# Patient Record
Sex: Female | Born: 1990 | Race: White | Hispanic: No | Marital: Single | Smoking: Former smoker
Health system: Southern US, Community
[De-identification: ages and names within clinical notes are randomized; demographics above are authoritative.]

## PROBLEM LIST (undated history)

## (undated) ENCOUNTER — Ambulatory Visit: Admission: EM | Payer: Medicaid Other

## (undated) DIAGNOSIS — R112 Nausea with vomiting, unspecified: Secondary | ICD-10-CM

## (undated) DIAGNOSIS — Z9889 Other specified postprocedural states: Secondary | ICD-10-CM

## (undated) DIAGNOSIS — F410 Panic disorder [episodic paroxysmal anxiety] without agoraphobia: Secondary | ICD-10-CM

## (undated) DIAGNOSIS — K219 Gastro-esophageal reflux disease without esophagitis: Secondary | ICD-10-CM

## (undated) DIAGNOSIS — T4145XA Adverse effect of unspecified anesthetic, initial encounter: Secondary | ICD-10-CM

## (undated) DIAGNOSIS — R51 Headache: Secondary | ICD-10-CM

## (undated) DIAGNOSIS — Z87442 Personal history of urinary calculi: Secondary | ICD-10-CM

## (undated) DIAGNOSIS — K649 Unspecified hemorrhoids: Secondary | ICD-10-CM

## (undated) DIAGNOSIS — R001 Bradycardia, unspecified: Secondary | ICD-10-CM

## (undated) DIAGNOSIS — T8859XA Other complications of anesthesia, initial encounter: Secondary | ICD-10-CM

## (undated) DIAGNOSIS — R519 Headache, unspecified: Secondary | ICD-10-CM

## (undated) DIAGNOSIS — J45909 Unspecified asthma, uncomplicated: Secondary | ICD-10-CM

## (undated) HISTORY — PX: TONSILLECTOMY: SUR1361

## (undated) HISTORY — PX: LAPAROSCOPIC GASTRIC RESTRICTIVE DUODENAL PROCEDURE (DUODENAL SWITCH): SHX6667

## (undated) HISTORY — PX: CHOLECYSTECTOMY: SHX55

## (undated) HISTORY — PX: ADENOIDECTOMY: SUR15

---
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
## 2008-12-04 ENCOUNTER — Emergency Department: Payer: Self-pay | Admitting: Emergency Medicine

## 2008-12-18 ENCOUNTER — Emergency Department: Payer: Self-pay | Admitting: Emergency Medicine

## 2009-02-28 ENCOUNTER — Emergency Department: Payer: Self-pay | Admitting: Emergency Medicine

## 2009-06-17 ENCOUNTER — Emergency Department: Payer: Self-pay | Admitting: Emergency Medicine

## 2009-10-14 ENCOUNTER — Emergency Department: Payer: Self-pay | Admitting: Emergency Medicine

## 2009-10-16 ENCOUNTER — Emergency Department: Payer: Self-pay | Admitting: Emergency Medicine

## 2011-03-22 ENCOUNTER — Emergency Department: Payer: Self-pay | Admitting: Emergency Medicine

## 2013-12-27 ENCOUNTER — Ambulatory Visit: Payer: Self-pay | Admitting: Physician Assistant

## 2013-12-29 ENCOUNTER — Ambulatory Visit: Payer: Self-pay | Admitting: Physician Assistant

## 2014-01-01 LAB — RAPID INFLUENZA A&B ANTIGENS

## 2014-12-12 ENCOUNTER — Emergency Department: Payer: Self-pay | Admitting: Emergency Medicine

## 2015-02-14 ENCOUNTER — Emergency Department: Payer: Self-pay | Admitting: Emergency Medicine

## 2015-12-27 ENCOUNTER — Ambulatory Visit
Admission: EM | Admit: 2015-12-27 | Discharge: 2015-12-27 | Disposition: A | Discharge status: Home / Self Care | Payer: Self-pay | Attending: Family Medicine | Admitting: Family Medicine

## 2015-12-27 DIAGNOSIS — J069 Acute upper respiratory infection, unspecified: Secondary | ICD-10-CM

## 2015-12-27 MED ORDER — AZITHROMYCIN 250 MG PO TABS: ORAL_TABLET | ORAL | Status: DC

## 2015-12-27 MED ORDER — AZITHROMYCIN 500 MG PO TABS: ORAL_TABLET | ORAL | Status: DC

## 2015-12-27 MED ORDER — HYDROCOD POLST-CPM POLST ER 10-8 MG/5ML PO SUER: 5.0000 mL | Freq: Two times a day (BID) | ORAL | Status: DC | PRN

## 2015-12-27 MED ORDER — FEXOFENADINE-PSEUDOEPHED ER 180-240 MG PO TB24: 1.0000 | ORAL_TABLET | Freq: Every day | ORAL | Status: DC

## 2015-12-27 MED ORDER — HYDROCOD POLST-CPM POLST ER 10-8 MG/5ML PO SUER: 5.0000 mL | Freq: Two times a day (BID) | ORAL | Status: DC

## 2015-12-27 MED ORDER — FLUTICASONE PROPIONATE 50 MCG/ACT NA SUSP: 2.0000 | Freq: Every day | NASAL | Status: DC

## 2015-12-27 NOTE — Discharge Instructions (Signed)
Cough, Adult A cough helps to clear your throat and lungs. A cough may last only 2-3 weeks (acute), or it may last longer than 8 weeks (chronic). Many different things can cause a cough. A cough may be a sign of an illness or another medical condition. HOME CARE  Pay attention to any changes in your cough.  Take medicines only as told by your doctor.  If you were prescribed an antibiotic medicine, take it as told by your doctor. Do not stop taking it even if you start to feel better.  Talk with your doctor before you try using a cough medicine.  Drink enough fluid to keep your pee (urine) clear or pale yellow.  If the air is dry, use a cold steam vaporizer or humidifier in your home.  Stay away from things that make you cough at work or at home.  If your cough is worse at night, try using extra pillows to raise your head up higher while you sleep.  Do not smoke, and try not to be around smoke. If you need help quitting, ask your doctor.  Do not have caffeine.  Do not drink alcohol.  Rest as needed. GET HELP IF:  You have new problems (symptoms).  You cough up yellow fluid (pus).  Your cough does not get better after 2-3 weeks, or your cough gets worse.  Medicine does not help your cough and you are not sleeping well.  You have pain that gets worse or pain that is not helped with medicine.  You have a fever.  You are losing weight and you do not know why.  You have night sweats. GET HELP RIGHT AWAY IF:  You cough up blood.  You have trouble breathing.  Your heartbeat is very fast.   This information is not intended to replace advice given to you by your health care provider. Make sure you discuss any questions you have with your health care provider.   Document Released: 08/21/2011 Document Revised: 08/29/2015 Document Reviewed: 02/14/2015 Elsevier Interactive Patient Education 2016 [employer].  [employer] Vaporizers Vaporizers may help relieve the  symptoms of a cough and cold. They add moisture to the air, which helps mucus to become thinner and less sticky. This makes it easier to breathe and cough up secretions. Cool mist vaporizers do not cause serious burns like hot mist vaporizers, which may also be called steamers or humidifiers. Vaporizers have not been proven to help with colds. You should not use a vaporizer if you are allergic to mold. HOME CARE INSTRUCTIONS  Follow the package instructions for the vaporizer.  Do not use anything other than distilled water in the vaporizer.  Do not run the vaporizer all of the time. This can cause mold or bacteria to grow in the vaporizer.  Clean the vaporizer after each time it is used.  Clean and dry the vaporizer well before storing it.  Stop using the vaporizer if worsening respiratory symptoms develop.   This information is not intended to replace advice given to you by your health care provider. Make sure you discuss any questions you have with your health care provider.   Document Released: 09/04/2004 Document Revised: 12/13/2013 Document Reviewed: 04/27/2013 Elsevier Interactive Patient Education 2016 Elsevier Inc.  Upper Respiratory Infection, Adult Most upper respiratory infections (URIs) are a viral infection of the air passages leading to the lungs. A URI affects the nose, throat, and upper air passages. The most common type of URI is nasopharyngitis  and is typically referred to as "the common cold." URIs run their course and usually go away on their own. Most of the time, a URI does not require medical attention, but sometimes a bacterial infection in the upper airways can follow a viral infection. This is called a secondary infection. Sinus and middle ear infections are common types of secondary upper respiratory infections. Bacterial pneumonia can also complicate a URI. A URI can worsen asthma and chronic obstructive pulmonary disease (COPD). Sometimes, these complications can  require emergency medical care and may be life threatening.  CAUSES Almost all URIs are caused by viruses. A virus is a type of germ and can spread from one person to another.  RISKS FACTORS You may be at risk for a URI if:   You smoke.   You have chronic heart or lung disease.  You have a weakened defense (immune) system.   You are very young or very old.   You have nasal allergies or asthma.  You work in crowded or poorly ventilated areas.  You work in health care facilities or schools. SIGNS AND SYMPTOMS  Symptoms typically develop 2-3 days after you come in contact with a cold virus. Most viral URIs last 7-10 days. However, viral URIs from the influenza virus (flu virus) can last 14-18 days and are typically more severe. Symptoms may include:   Runny or stuffy (congested) nose.   Sneezing.   Cough.   Sore throat.   Headache.   Fatigue.   Fever.   Loss of appetite.   Pain in your forehead, behind your eyes, and over your cheekbones (sinus pain).  Muscle aches.  DIAGNOSIS  Your health care provider may diagnose a URI by:  Physical exam.  Tests to check that your symptoms are not due to another condition such as:  Strep throat.  Sinusitis.  Pneumonia.  Asthma. TREATMENT  A URI goes away on its own with time. It cannot be cured with medicines, but medicines may be prescribed or recommended to relieve symptoms. Medicines may help:  Reduce your fever.  Reduce your cough.  Relieve nasal congestion. HOME CARE INSTRUCTIONS   Take medicines only as directed by your health care provider.   Gargle warm saltwater or take cough drops to comfort your throat as directed by your health care provider.  Use a warm mist humidifier or inhale steam from a shower to increase air moisture. This may make it easier to breathe.  Drink enough fluid to keep your urine clear or pale yellow.   Eat soups and other clear broths and maintain good nutrition.    Rest as needed.   Return to work when your temperature has returned to normal or as your health care provider advises. You may need to stay home longer to avoid infecting others. You can also use a face mask and careful hand washing to prevent spread of the virus.  Increase the usage of your inhaler if you have asthma.   Do not use any tobacco products, including cigarettes, chewing tobacco, or electronic cigarettes. If you need help quitting, ask your health care provider. PREVENTION  The best way to protect yourself from getting a cold is to practice good hygiene.   Avoid oral or hand contact with people with cold symptoms.   Wash your hands often if contact occurs.  There is no clear evidence that vitamin C, vitamin E, echinacea, or exercise reduces the chance of developing a cold. However, it is always recommended to get  plenty of rest, exercise, and practice good nutrition.  SEEK MEDICAL CARE IF:   You are getting worse rather than better.   Your symptoms are not controlled by medicine.   You have chills.  You have worsening shortness of breath.  You have brown or red mucus.  You have yellow or brown nasal discharge.  You have pain in your face, especially when you bend forward.  You have a fever.  You have swollen neck glands.  You have pain while swallowing.  You have white areas in the back of your throat. SEEK IMMEDIATE MEDICAL CARE IF:   You have severe or persistent:  Headache.  Ear pain.  Sinus pain.  Chest pain.  You have chronic lung disease and any of the following:  Wheezing.  Prolonged cough.  Coughing up blood.  A change in your usual mucus.  You have a stiff neck.  You have changes in your:  Vision.  Hearing.  Thinking.  Mood. MAKE SURE YOU:   Understand these instructions.  Will watch your condition.  Will get help right away if you are not doing well or get worse.   This information is not intended to  replace advice given to you by your health care provider. Make sure you discuss any questions you have with your health care provider.   Document Released: 06/03/2001 Document Revised: 04/24/2015 Document Reviewed: 03/15/2014 Elsevier Interactive Patient Education [employer]

## 2015-12-27 NOTE — ED Notes (Signed)
Past week with head congestion.  OTCs not helping.  Ears hurt to palpate.  Pressure is all in her head.

## 2015-12-27 NOTE — ED Notes (Signed)
Non-toxic appearing pt.  Reporting cough with lots of phlegm production.  In NAD.

## 2015-12-27 NOTE — ED Provider Notes (Signed)
CSN: [id number]     Arrival date & time 12/27/15  1908 History   First MD Initiated Contact with Patient 12/27/15 1956     Chief Complaint  Patient presents with  . Sinusitis  . Facial Pain  . Otalgia   (Consider location/radiation/quality/duration/timing/severity/associated sxs/prior Treatment) HPI   A 25 year old female who presents with a one-week history of head congestion and cough mostly at nighttime. He said the cough is productive of yellow sputum in the morning. Her nose is running all day long at times it feels like it is clogged. Tried numerous over-the-counter remedies which have not been effective. Running a very low-grade temperature and she states 998 at work today. As you Sudafed NyQuil and DayQuil. She is afebrile today at 97.8 pulse 80 respirations 16 blood pressure 144/78 and PO2 of 98% on room air.  History reviewed. No pertinent past medical history. Past Surgical History  Procedure Laterality Date  . Cesarean section    . Tonsillectomy    . Adenoidectomy     History reviewed. No pertinent family history. Social History  Substance Use Topics  . Smoking status: Current Every Day Smoker -- 0.25 packs/day    Types: Cigarettes  . Smokeless tobacco: None  . Alcohol Use: No   OB History    No data available     Review of Systems  Constitutional: Positive for fever and fatigue. Negative for chills, diaphoresis and activity change.  HENT: Positive for congestion, postnasal drip, rhinorrhea, sinus pressure and sneezing.   Respiratory: Positive for cough. Negative for shortness of breath, wheezing and stridor.   All other systems reviewed and are negative.   Allergies  Morphine and related  Home Medications   Prior to Admission medications   Medication Sig Start Date End Date Taking? Authorizing Provider  azithromycin (ZITHROMAX Z-PAK) 250 MG tablet Use as per package instructions 12/27/15   Lutricia Feil, PA-C  chlorpheniramine-HYDROcodone [facility]) 10-8 MG/5ML SUER Take 5 mLs by mouth every 12 (twelve) hours as needed for cough. 12/27/15   Hassan Rowan, MD  chlorpheniramine-HYDROcodone [facility] PENNKINETIC ER) 10-8 MG/5ML SUER Take 5 mLs by mouth 2 (two) times daily. 12/27/15   Lutricia Feil, PA-C  fexofenadine-pseudoephedrine (ALLEGRA-D ALLERGY & CONGESTION) 180-240 MG 24 hr tablet Take 1 tablet by mouth daily. 12/27/15   Hassan Rowan, MD  fluticasone (FLONASE) 50 MCG/ACT nasal spray Place 2 sprays into both nostrils daily. 12/27/15   Lutricia Feil, PA-C   Meds Ordered and Administered this Visit  Medications - No data to display  BP 144/78 mmHg  Pulse 84  Temp(Src) 97.8 F (36.6 C) (Oral)  Resp 16  SpO2 98% No data found.   Physical Exam  ED Course  Procedures (including critical care time)  Labs Review Labs Reviewed - No data to display  Imaging Review No results found.   Visual Acuity Review  Right Eye Distance:   Left Eye Distance:   Bilateral Distance:    Right Eye Near:   Left Eye Near:    Bilateral Near:         MDM   1. Acute URI    Discharge Medication List as of 12/27/2015  8:14 PM    START taking these medications   Details  azithromycin (ZITHROMAX Z-PAK) 250 MG tablet Use as per package instructions, Print    !! chlorpheniramine-HYDROcodone (TUSSIONEX PENNKINETIC ER) 10-8 MG/5ML SUER Take 5 mLs by mouth every 12 (twelve) hours as needed for cough., Starting 12/27/2015, Until  Discontinued, Normal    !! chlorpheniramine-HYDROcodone (TUSSIONEX PENNKINETIC ER) 10-8 MG/5ML SUER Take 5 mLs by mouth 2 (two) times daily., Starting 12/27/2015, Until Discontinued, Print    fexofenadine-pseudoephedrine (ALLEGRA-D ALLERGY & CONGESTION) 180-240 MG 24 hr tablet Take 1 tablet by mouth daily., Starting 12/27/2015, Until Discontinued, Print    fluticasone (FLONASE) 50 MCG/ACT nasal spray Place 2 sprays into both nostrils daily., Starting 12/27/2015, Until Discontinued, Print     !! - Potential  duplicate medications found. Please discuss with provider.    Plan: 1. Diagnosis reviewed with patient 2. rx as per orders; risks, benefits, potential side effects reviewed with patient 3. Recommend supportive treatment with rest as much as possible and increase fluids. Been using her son's Flonase and I have told her that she needs to purchase her own passing back and forth. She is a Production designer, theatre/television/filmmanager at [employer] and is unable to take any time off since she is just returning from her 4 day break. Have given her prescription discount card to assist her and she is uninsured. She continued with the over-the-counter medications 4. F/u prn if symptoms worsen or don't improve     Lutricia FeilWilliam P Eva Griffo, PA-C 12/27/15 2024

## 2016-07-01 ENCOUNTER — Ambulatory Visit
Admission: EM | Admit: 2016-07-01 | Discharge: 2016-07-01 | Disposition: A | Discharge status: Home / Self Care | Payer: Self-pay | Attending: Internal Medicine | Admitting: Internal Medicine

## 2016-07-01 DIAGNOSIS — L739 Follicular disorder, unspecified: Secondary | ICD-10-CM

## 2016-07-01 MED ORDER — PREDNISONE 50 MG PO TABS: 50.0000 mg | ORAL_TABLET | Freq: Every day | ORAL | Status: DC

## 2016-07-01 MED ORDER — SULFAMETHOXAZOLE-TRIMETHOPRIM 800-160 MG PO TABS: 1.0000 | ORAL_TABLET | Freq: Two times a day (BID) | ORAL | Status: AC

## 2016-07-01 NOTE — Discharge Instructions (Signed)
Anticipate gradual improvement over the next several days.  Prescriptions for trimethoprim/sulfamethoxazole and prednisone [facility].   Recheck for fever >100.5; bumps or sores around mouth/eyes/genitals; or if not starting to improve in 3-5 days.

## 2016-07-01 NOTE — ED Provider Notes (Signed)
CSN: [id number]     Arrival date & time 07/01/16  1230 History   First MD Initiated Contact with Patient 07/01/16 1419     Chief Complaint  Patient presents with  . Rash   HPI  24yo lady presents today with 2d hx extremely itchy red pimply bumps R posterior shoulder, B arms, one of her upper/posterior legs. No new achiness, no fever.  1 episode diarrhea after eating yesterday.  No N/V, no cough.  Itching comes and goes.   Toddler son sleeps with her and does not have it. Hx eczema; seeing dermatologist for this in a few days.   Past medical history:  eczema Past Surgical History  Procedure Laterality Date  . Cesarean section    . Tonsillectomy    . Adenoidectomy     History reviewed. No pertinent family history. Social History  Substance Use Topics  . Smoking status: Current Every Day Smoker -- 0.25 packs/day    Types: Cigarettes  . Smokeless tobacco: None  . Alcohol Use: No    Review of Systems  All other systems reviewed and are negative.   Allergies  Morphine and related  Home Medications   Topicals for eczema    BP 127/66 mmHg  Pulse 73  Temp(Src) 98.8 F (37.1 C) (Oral)  Resp 18  Ht 5\' 3"  (1.6 m)  Wt 265 lb (120.203 kg)  BMI 46.95 kg/m2  SpO2 97% Physical Exam  Constitutional: She is oriented to person, place, and time. No distress.  Alert, nicely groomed  HENT:  Head: Atraumatic.  Eyes:  Conjugate gaze, no eye redness/drainage  Neck: Neck supple.  Cardiovascular: Normal rate.   Pulmonary/Chest: No respiratory distress.  Abdominal: She exhibits no distension.  Musculoskeletal: Normal range of motion.  No leg swelling  Neurological: She is alert and oriented to person, place, and time.  Skin: Skin is warm and dry.     No cyanosis Deep pustules, firm, on red base in clusters over R posterior shoulder, both arms, upper/posterior thigh.  No ulceration/seeping/drainage.  Not tender.  Nursing note and vitals reviewed.   ED Course  Procedures  (including critical care time)  none  MDM   1. Follicular impetigo    Meds ordered this encounter  Medications  . sulfamethoxazole-trimethoprim (BACTRIM DS,SEPTRA DS) 800-160 MG tablet    Sig: Take 1 tablet by mouth 2 (two) times daily.    Dispense:  14 tablet    Refill:  0  . predniSONE (DELTASONE) 50 MG tablet    Sig: Take 1 tablet (50 mg total) by mouth daily.    Dispense:  3 tablet    Refill:  0   Recheck or [facility] if not starting to improve in a few days, or for fever >100.5/rash involving mouth/eyes/genitals.    Eustace MooreLaura W Devlin Brink, MD 07/02/16 1410

## 2016-07-01 NOTE — ED Notes (Addendum)
Patient noticed on Sunday that she had some bumps on her right shoulder that were very itchy. They seem to be getting worse and she had tired benadryl and hydrocortisone cream OTC with no relief. Bumps are located on right shoulder, bilateral arms and a few on her legs. Severe itching.

## 2016-12-22 DIAGNOSIS — I82409 Acute embolism and thrombosis of unspecified deep veins of unspecified lower extremity: Secondary | ICD-10-CM

## 2016-12-22 HISTORY — DX: Acute embolism and thrombosis of unspecified deep veins of unspecified lower extremity: I82.409

## 2018-01-04 ENCOUNTER — Inpatient Hospital Stay
Admission: RE | Admit: 2018-01-04 | Discharge: 2018-01-04 | Disposition: A | Discharge status: Home / Self Care | Payer: Self-pay | Source: Ambulatory Visit

## 2018-01-05 ENCOUNTER — Encounter
Admission: RE | Admit: 2018-01-05 | Discharge: 2018-01-05 | Disposition: A | Discharge status: Home / Self Care | Payer: BC Managed Care – PPO | Source: Ambulatory Visit | Attending: Obstetrics & Gynecology | Admitting: Obstetrics & Gynecology

## 2018-01-05 ENCOUNTER — Other Ambulatory Visit: Payer: Self-pay

## 2018-01-05 HISTORY — DX: Headache: R51

## 2018-01-05 HISTORY — DX: Other complications of anesthesia, initial encounter: T88.59XA

## 2018-01-05 HISTORY — DX: Unspecified hemorrhoids: K64.9

## 2018-01-05 HISTORY — DX: Personal history of urinary calculi: Z87.442

## 2018-01-05 HISTORY — DX: Gastro-esophageal reflux disease without esophagitis: K21.9

## 2018-01-05 HISTORY — DX: Headache, unspecified: R51.9

## 2018-01-05 HISTORY — DX: Nausea with vomiting, unspecified: R11.2

## 2018-01-05 HISTORY — DX: Adverse effect of unspecified anesthetic, initial encounter: T41.45XA

## 2018-01-05 HISTORY — DX: Panic disorder (episodic paroxysmal anxiety): F41.0

## 2018-01-05 HISTORY — DX: Other specified postprocedural states: Z98.890

## 2018-01-05 HISTORY — DX: Bradycardia, unspecified: R00.1

## 2018-01-05 NOTE — Patient Instructions (Signed)
  Your procedure is scheduled on: 01-11-18 MONDAY Report to Same Day Surgery 2nd floor medical mall [facility](Medical Mall Entrance-take elevator on left to 2nd floor.  Check in with surgery information desk.) To find out your arrival time please call [phone] between 1PM - 3PM on 01-08-18 FRIDAY  Remember: Instructions that are not followed completely may result in serious medical risk, up to and including death, or upon the discretion of your surgeon and anesthesiologist your surgery may need to be rescheduled.    _x___ 1. Do not eat food after midnight the night before your procedure. NO GUM OR CANDY AFTER MIDNIGHT.  You may drink clear liquids up to 2 hours before you are scheduled to arrive at the hospital for your procedure.  Do not drink clear liquids within 2 hours of your scheduled arrival to the hospital.  Clear liquids include  --Water or Apple juice without pulp  --Clear carbohydrate beverage such as ClearFast or Gatorade  --Black Coffee or Clear Tea (No milk, no creamers, do not add anything to the coffee or Tea     __x__ 2. No Alcohol for 24 hours before or after surgery.   __x__3. No Smoking for 24 prior to surgery.   ____  4. Bring all medications with you on the day of surgery if instructed.    __x__ 5. Notify your doctor if there is any change in your medical condition     (cold, fever, infections).     Do not wear jewelry, make-up, hairpins, clips or nail polish.  Do not wear lotions, powders, or perfumes. You may wear deodorant.  Do not shave 48 hours prior to surgery. Men may shave face and neck.  Do not bring valuables to the hospital.    [facility] is not responsible for any belongings or valuables.               Contacts, dentures or bridgework may not be worn into surgery.  Leave your suitcase in the car. After surgery it may be brought to your room.  For patients admitted to the hospital, discharge time is determined by your treatment team.   Patients discharged  the day of surgery will not be allowed to drive home.  You will need someone to drive you home and stay with you the night of your procedure.    Please read over the following fact sheets that you were given:   [facility] Preparing for Surgery and or MRSA Information   ____ Take anti-hypertensive listed below, cardiac, seizure, asthma, anti-reflux and psychiatric medicines. These include:  1. NONE  2.  3.  4.  5.  6.  ____Fleets enema or Magnesium Citrate as directed.   ____ Use CHG Soap or sage wipes as directed on instruction sheet   ____ Use inhalers on the day of surgery and bring to hospital day of surgery  ____ Stop Metformin and Janumet 2 days prior to surgery.    ____ Take 1/2 of usual insulin dose the night before surgery and none on the morning surgery.   ____ Follow recommendations from Cardiologist, Pulmonologist or PCP regarding stopping Aspirin, Coumadin, Plavix ,Eliquis, Effient, or Pradaxa, and Pletal.  X____Stop Anti-inflammatories such as Advil, Aleve, Ibuprofen, Motrin, Naproxen, Naprosyn, Goodies powders or aspirin products NOW-OK to take Tylenol   ____ Stop supplements until after surgery.     ____ Bring C-Pap to the hospital.

## 2018-01-07 ENCOUNTER — Encounter
Admission: RE | Admit: 2018-01-07 | Discharge: 2018-01-07 | Disposition: A | Discharge status: Home / Self Care | Payer: BC Managed Care – PPO | Source: Ambulatory Visit | Attending: Obstetrics & Gynecology | Admitting: Obstetrics & Gynecology

## 2018-01-07 DIAGNOSIS — Z01812 Encounter for preprocedural laboratory examination: Secondary | ICD-10-CM | POA: Diagnosis not present

## 2018-01-07 LAB — CBC
HCT: 43.2 % (ref 35.0–47.0)
Hemoglobin: 14.4 g/dL (ref 12.0–16.0)
MCH: 29.7 pg (ref 26.0–34.0)
MCHC: 33.4 g/dL (ref 32.0–36.0)
MCV: 89 fL (ref 80.0–100.0)
PLATELETS: 189 10*3/uL (ref 150–440)
RBC: 4.86 MIL/uL (ref 3.80–5.20)
RDW: 13 % (ref 11.5–14.5)
WBC: 8.2 10*3/uL (ref 3.6–11.0)

## 2018-01-07 LAB — BASIC METABOLIC PANEL
Anion gap: 11 (ref 5–15)
BUN: 10 mg/dL (ref 6–20)
CALCIUM: 9.4 mg/dL (ref 8.9–10.3)
CO2: 25 mmol/L (ref 22–32)
CREATININE: 0.53 mg/dL (ref 0.44–1.00)
Chloride: 105 mmol/L (ref 101–111)
GFR calc non Af Amer: >60 mL/min (ref >60)
GLUCOSE: 84 mg/dL (ref 65–99)
Potassium: 3.2 mmol/L — ABNORMAL LOW (ref 3.5–5.1)
Sodium: 141 mmol/L (ref 135–145)

## 2018-01-07 LAB — TYPE AND SCREEN
ABO/RH(D): AB POS
Antibody Screen: NEGATIVE

## 2018-01-11 ENCOUNTER — Ambulatory Visit
Admission: RE | Admit: 2018-01-11 | Discharge: 2018-01-11 | Disposition: A | Discharge status: Home / Self Care | Payer: BC Managed Care – PPO | Source: Ambulatory Visit | Attending: Surgery | Admitting: Surgery

## 2018-01-11 ENCOUNTER — Encounter: Payer: Self-pay | Admitting: Surgery

## 2018-01-11 ENCOUNTER — Ambulatory Visit: Payer: BC Managed Care – PPO | Admitting: Anesthesiology

## 2018-01-11 ENCOUNTER — Encounter
Admission: RE | Disposition: A | Discharge status: Home / Self Care | Payer: Self-pay | Source: Ambulatory Visit | Attending: Obstetrics & Gynecology

## 2018-01-11 ENCOUNTER — Encounter: Payer: Self-pay | Admitting: Anesthesiology

## 2018-01-11 DIAGNOSIS — N938 Other specified abnormal uterine and vaginal bleeding: Secondary | ICD-10-CM | POA: Insufficient documentation

## 2018-01-11 DIAGNOSIS — K219 Gastro-esophageal reflux disease without esophagitis: Secondary | ICD-10-CM | POA: Diagnosis not present

## 2018-01-11 DIAGNOSIS — Z9049 Acquired absence of other specified parts of digestive tract: Secondary | ICD-10-CM | POA: Insufficient documentation

## 2018-01-11 DIAGNOSIS — Z825 Family history of asthma and other chronic lower respiratory diseases: Secondary | ICD-10-CM | POA: Insufficient documentation

## 2018-01-11 DIAGNOSIS — R9389 Abnormal findings on diagnostic imaging of other specified body structures: Secondary | ICD-10-CM | POA: Diagnosis not present

## 2018-01-11 DIAGNOSIS — K648 Other hemorrhoids: Secondary | ICD-10-CM | POA: Diagnosis not present

## 2018-01-11 DIAGNOSIS — Z808 Family history of malignant neoplasm of other organs or systems: Secondary | ICD-10-CM | POA: Insufficient documentation

## 2018-01-11 DIAGNOSIS — Z87891 Personal history of nicotine dependence: Secondary | ICD-10-CM | POA: Insufficient documentation

## 2018-01-11 DIAGNOSIS — K602 Anal fissure, unspecified: Secondary | ICD-10-CM | POA: Diagnosis not present

## 2018-01-11 DIAGNOSIS — G43909 Migraine, unspecified, not intractable, without status migrainosus: Secondary | ICD-10-CM | POA: Insufficient documentation

## 2018-01-11 DIAGNOSIS — Z79899 Other long term (current) drug therapy: Secondary | ICD-10-CM | POA: Insufficient documentation

## 2018-01-11 DIAGNOSIS — Z818 Family history of other mental and behavioral disorders: Secondary | ICD-10-CM | POA: Insufficient documentation

## 2018-01-11 DIAGNOSIS — Z87442 Personal history of urinary calculi: Secondary | ICD-10-CM | POA: Insufficient documentation

## 2018-01-11 DIAGNOSIS — Z820 Family history of epilepsy and other diseases of the nervous system: Secondary | ICD-10-CM | POA: Diagnosis not present

## 2018-01-11 DIAGNOSIS — F410 Panic disorder [episodic paroxysmal anxiety] without agoraphobia: Secondary | ICD-10-CM | POA: Diagnosis not present

## 2018-01-11 DIAGNOSIS — Z803 Family history of malignant neoplasm of breast: Secondary | ICD-10-CM | POA: Insufficient documentation

## 2018-01-11 HISTORY — PX: HYSTEROSCOPY WITH D & C: SHX1775

## 2018-01-11 HISTORY — PX: RECTAL EXAM UNDER ANESTHESIA: SHX6399

## 2018-01-11 HISTORY — PX: SPHINCTEROTOMY: SHX5279

## 2018-01-11 LAB — ABO/RH: ABO/RH(D): AB POS

## 2018-01-11 LAB — POCT PREGNANCY, URINE: Preg Test, Ur: NEGATIVE

## 2018-01-11 SURGERY — DILATATION AND CURETTAGE /HYSTEROSCOPY
Anesthesia: General | Wound class: Clean Contaminated

## 2018-01-11 MED ORDER — HYDROCODONE-ACETAMINOPHEN 5-325 MG PO TABS: 1.0000 | ORAL_TABLET | Freq: Four times a day (QID) | ORAL | 0 refills | Status: DC | PRN

## 2018-01-11 MED ORDER — PROPOFOL 10 MG/ML IV BOLUS
INTRAVENOUS | Status: DC | PRN
  Administered 2018-01-11: 200 mg via INTRAVENOUS

## 2018-01-11 MED ORDER — FENTANYL CITRATE (PF) 100 MCG/2ML IJ SOLN
25.0000 ug | INTRAMUSCULAR | Status: AC | PRN
  Administered 2018-01-11 (×6): 25 ug via INTRAVENOUS

## 2018-01-11 MED ORDER — BUPIVACAINE-EPINEPHRINE (PF) 0.5% -1:200000 IJ SOLN
INTRAMUSCULAR | Status: AC
  Filled 2018-01-11: qty 30

## 2018-01-11 MED ORDER — FENTANYL CITRATE (PF) 100 MCG/2ML IJ SOLN
INTRAMUSCULAR | Status: AC
  Administered 2018-01-11: 25 ug via INTRAVENOUS
  Filled 2018-01-11: qty 2

## 2018-01-11 MED ORDER — LIDOCAINE HCL (CARDIAC) 20 MG/ML IV SOLN
INTRAVENOUS | Status: DC | PRN
  Administered 2018-01-11: 50 mg via INTRAVENOUS

## 2018-01-11 MED ORDER — HYDROMORPHONE HCL 1 MG/ML IJ SOLN
INTRAMUSCULAR | Status: AC
  Filled 2018-01-11: qty 1

## 2018-01-11 MED ORDER — EPHEDRINE SULFATE 50 MG/ML IJ SOLN
INTRAMUSCULAR | Status: DC | PRN
  Administered 2018-01-11 (×2): 10 mg via INTRAVENOUS

## 2018-01-11 MED ORDER — KETOROLAC TROMETHAMINE 30 MG/ML IJ SOLN
INTRAMUSCULAR | Status: AC
  Filled 2018-01-11: qty 1

## 2018-01-11 MED ORDER — BUPIVACAINE-EPINEPHRINE (PF) 0.5% -1:200000 IJ SOLN
INTRAMUSCULAR | Status: DC | PRN
  Administered 2018-01-11: 3 mL via PERINEURAL

## 2018-01-11 MED ORDER — ACETAMINOPHEN 650 MG RE SUPP
650.0000 mg | RECTAL | Status: DC | PRN
Start: 2018-01-11 — End: 2018-01-11

## 2018-01-11 MED ORDER — SILVER NITRATE-POT NITRATE 75-25 % EX MISC
CUTANEOUS | Status: AC
  Filled 2018-01-11: qty 1

## 2018-01-11 MED ORDER — KETOROLAC TROMETHAMINE 30 MG/ML IJ SOLN
30.0000 mg | Freq: Four times a day (QID) | INTRAMUSCULAR | Status: DC
  Administered 2018-01-11: 30 mg via INTRAVENOUS

## 2018-01-11 MED ORDER — EPHEDRINE SULFATE 50 MG/ML IJ SOLN
INTRAMUSCULAR | Status: AC
  Filled 2018-01-11: qty 1

## 2018-01-11 MED ORDER — MIDAZOLAM HCL 2 MG/2ML IJ SOLN
INTRAMUSCULAR | Status: DC | PRN
  Administered 2018-01-11: 2 mg via INTRAVENOUS

## 2018-01-11 MED ORDER — HYDROMORPHONE HCL 1 MG/ML IJ SOLN
0.5000 mg | INTRAMUSCULAR | Status: DC | PRN
  Administered 2018-01-11: 0.5 mg via INTRAVENOUS

## 2018-01-11 MED ORDER — ACETAMINOPHEN 325 MG PO TABS: 650.0000 mg | ORAL_TABLET | ORAL | Status: DC | PRN

## 2018-01-11 MED ORDER — ACETAMINOPHEN NICU IV SYRINGE 10 MG/ML
INTRAVENOUS | Status: AC
  Filled 2018-01-11: qty 1

## 2018-01-11 MED ORDER — LIDOCAINE HCL (PF) 2 % IJ SOLN
INTRAMUSCULAR | Status: AC
  Filled 2018-01-11: qty 10

## 2018-01-11 MED ORDER — LACTATED RINGERS IV SOLN: INTRAVENOUS | Status: DC

## 2018-01-11 MED ORDER — MIDAZOLAM HCL 2 MG/2ML IJ SOLN
INTRAMUSCULAR | Status: AC
  Filled 2018-01-11: qty 2

## 2018-01-11 MED ORDER — DEXAMETHASONE SODIUM PHOSPHATE 10 MG/ML IJ SOLN
INTRAMUSCULAR | Status: DC | PRN
  Administered 2018-01-11: 10 mg via INTRAVENOUS

## 2018-01-11 MED ORDER — ONDANSETRON HCL 4 MG/2ML IJ SOLN
INTRAMUSCULAR | Status: DC | PRN
  Administered 2018-01-11: 4 mg via INTRAVENOUS

## 2018-01-11 MED ORDER — ACETAMINOPHEN 10 MG/ML IV SOLN
INTRAVENOUS | Status: DC | PRN
  Administered 2018-01-11: 1000 mg via INTRAVENOUS

## 2018-01-11 MED ORDER — FENTANYL CITRATE (PF) 100 MCG/2ML IJ SOLN
INTRAMUSCULAR | Status: DC | PRN
  Administered 2018-01-11 (×4): 50 ug via INTRAVENOUS

## 2018-01-11 MED ORDER — LACTATED RINGERS IV SOLN
INTRAVENOUS | Status: DC
  Administered 2018-01-11: 11:00:00 via INTRAVENOUS

## 2018-01-11 MED ORDER — PROPOFOL 10 MG/ML IV BOLUS
INTRAVENOUS | Status: AC
  Filled 2018-01-11: qty 20

## 2018-01-11 MED ORDER — FENTANYL CITRATE (PF) 100 MCG/2ML IJ SOLN
INTRAMUSCULAR | Status: AC
  Filled 2018-01-11: qty 2

## 2018-01-11 MED ORDER — ONDANSETRON HCL 4 MG/2ML IJ SOLN: 4.0000 mg | Freq: Once | INTRAMUSCULAR | Status: DC | PRN

## 2018-01-11 SURGICAL SUPPLY — 26 items
BAG INFUSER PRESSURE 100CC (MISCELLANEOUS) ×4 IMPLANT
BLADE SURG 15 STRL LF DISP TIS (BLADE) ×2 IMPLANT
BLADE SURG 15 STRL SS (BLADE) ×2
COUNTER NEEDLE 20/40 LG (NEEDLE) ×4 IMPLANT
CUP MEDICINE 2OZ PLAST GRAD ST (MISCELLANEOUS) ×4 IMPLANT
ELECT REM PT RETURN 9FT ADLT (ELECTROSURGICAL) ×4
ELECTRODE REM PT RTRN 9FT ADLT (ELECTROSURGICAL) ×2 IMPLANT
GLOVE PI ORTHOPRO 6.5 (GLOVE) ×2
GLOVE PI ORTHOPRO STRL 6.5 (GLOVE) ×2 IMPLANT
GLOVE SURG SYN 6.5 ES PF (GLOVE) ×4 IMPLANT
GOWN STRL REUS W/ TWL LRG LVL3 (GOWN DISPOSABLE) ×4 IMPLANT
GOWN STRL REUS W/TWL LRG LVL3 (GOWN DISPOSABLE) ×4
HANDLE YANKAUER SUCT BULB TIP (MISCELLANEOUS) ×4 IMPLANT
IV LACTATED RINGERS 1000ML (IV SOLUTION) ×4 IMPLANT
KIT RM TURNOVER CYSTO AR (KITS) ×4 IMPLANT
NEEDLE HYPO 25X1 1.5 SAFETY (NEEDLE) ×4 IMPLANT
NEEDLE SPNL 22GX3.5 QUINCKE BK (NEEDLE) ×4 IMPLANT
PACK DNC HYST (MISCELLANEOUS) ×4 IMPLANT
PAD OB MATERNITY 4.3X12.25 (PERSONAL CARE ITEMS) ×4 IMPLANT
PAD PREP 24X41 OB/GYN DISP (PERSONAL CARE ITEMS) ×4 IMPLANT
PENCIL ELECTRO HAND CTR (MISCELLANEOUS) ×4 IMPLANT
SPONGE XRAY 4X4 16PLY STRL (MISCELLANEOUS) ×4 IMPLANT
SUT CHROMIC 4 0 SH 27 (SUTURE) ×4 IMPLANT
SYR 10ML LL (SYRINGE) ×8 IMPLANT
TUBING CONNECTING 10 (TUBING) ×6 IMPLANT
TUBING CONNECTING 10' (TUBING) ×2

## 2018-01-11 NOTE — Op Note (Signed)
OPERATIVE REPORT  PREOPERATIVE  DIAGNOSIS: .  Anal pain, internal hemorrhoids  POSTOPERATIVE DIAGNOSIS: .  Posterior anal fissure  PROCEDURE: .  Rectal exam under anesthesia and lateral internal anal sphincterotomy  ANESTHESIA:  General  SURGEON: Renda RollsWilton Smith  MD   INDICATIONS: .  She has history of anal pain and recent findings of internal hemorrhoids.  She had recent internal hemorrhoid rubber band.  She reports persistent anal pain.  Rectal exam under anesthesia was recommended for further evaluation and discussed potential need for further surgery.  With the patient on the operating table under general anesthesia in the lithotomy position after completing D&C the rectum was examined.  External anal appearance found small [facility] which was removed with a moist sponge.  Traction of the anoderm did demonstrate a posterior anal fissure which was small in size.  The anal canal was dilated large enough to admit 3 fingers.  There was no palpable mass although could detect some mild degree of local firmness related to recent internal hemorrhoidal band ligation.  The bivalve anal retractor was introduced.  There was scarring found at the site of recent internal hemorrhoid rubber band ligation at 3 sites.  It appears that the hemorrhoids had already sloughed off.  There appeared to be no additional significant internal hemorrhoids.  There was no visible polyp or tumor.  Seeing the posterior fissure a decision was made to carry out a lateral internal anal sphincterotomy at the 3 o'clock position.  The anoderm was further prepared with Betadine and then infiltrated locally with 0.5% Sensorcaine with epinephrine.  A 12 mm incision was made and the dissection was carried down through subcutaneous tissues using electrocautery for hemostasis.  Hemostats were used to bluntly dissected and exposed the internal anal fissure which was recognized by its position and its white color.  90% of the  internal anal sphincter was divided with electrocautery.  Bleeding was minimal and hemostasis subsequently intact.  The skin edges were approximated with interrupted 4-0 chromic simple sutures leaving a small opening at the lower end for drainage.  The patient appeared to tolerate the procedure satisfactorily.  The nurses applied a peri-pad and the patient was prepared for transfer to the recovery room  Renda RollsWilton Smith MD

## 2018-01-11 NOTE — Discharge Instructions (Addendum)
Use a pad tucked in underwear and change as needed for drainage.  Take Tylenol or Norco if needed for pain.  Should not drive or do anything dangerous when taking Norco.  Follow-up in the general surgery office in 4 weeks. (above instructions from Dr. Katrinka Blazing)    Dilation and Curettage or Vacuum Curettage, Care After These instructions give you information about caring for yourself after your procedure. Your doctor may also give you more specific instructions. Call your doctor if you have any problems or questions after your procedure. Follow these instructions at home: Activity  Do not drive or use heavy machinery while taking prescription pain medicine.  For 24 hours after your procedure, avoid driving.  Take short walks often, followed by rest periods. Ask your doctor what activities are safe for you. After one or two days, you may be able to return to your normal activities.  Do not lift anything that is heavier than 10 lb (4.5 kg) until your doctor approves.  For at least 2 weeks, or as long as told by your doctor: ? Do not douche. ? Do not use tampons. ? Do not have sex. General instructions  Take over-the-counter and prescription medicines only as told by your doctor. This is very important if you take blood thinning medicine.  Do not take baths, swim, or use a hot tub until your doctor approves. Take showers instead of baths.  Wear compression stockings as told by your doctor.  It is up to you to get the results of your procedure. Ask your doctor when your results will be ready.  Keep all follow-up visits as told by your doctor. This is important. Contact a doctor if:  You have very bad cramps that get worse or do not get better with medicine.  You have very bad pain in your belly (abdomen).  You cannot drink fluids without throwing up (vomiting).  You get pain in a different part of the area between your belly and thighs (pelvis).  You have bad-smelling  discharge from your vagina.  You have a rash. Get help right away if:  You are bleeding a lot from your vagina. A lot of bleeding means soaking more than one sanitary pad in an hour, for 2 hours in a row.  You have clumps of blood (blood clots) coming from your vagina.  You have a fever or chills.  Your belly feels very tender or hard.  You have chest pain.  You have trouble breathing.  You cough up blood.  You feel dizzy.  You feel light-headed.  You pass out (faint).  You have pain in your neck or shoulder area. Summary  Take short walks often, followed by rest periods. Ask your doctor what activities are safe for you. After one or two days, you may be able to return to your normal activities.  Do not lift anything that is heavier than 10 lb (4.5 kg) until your doctor approves.  Do not take baths, swim, or use a hot tub until your doctor approves. Take showers instead of baths.  Contact your doctor if you have any symptoms of infection, like bad-smelling discharge from your vagina. This information is not intended to replace advice given to you by your health care provider. Make sure you discuss any questions you have with your health care provider. Document Released: 09/16/2008 Document Revised: 08/25/2016 Document Reviewed: 08/25/2016 Elsevier Interactive Patient Education  2017 [employer]     AMBULATORY SURGERY  DISCHARGE INSTRUCTIONS  1) The drugs that you were given will stay in your system until tomorrow so for the next 24 hours you should not:  A) Drive an automobile B) Make any legal decisions C) Drink any alcoholic beverage   2) You may resume regular meals tomorrow.  Today it is better to start with liquids and gradually work up to solid foods.  You may eat anything you prefer, but it is better to start with liquids, then soup and crackers, and gradually work up to solid foods.   3) Please notify your doctor immediately if you have any  unusual bleeding, trouble breathing, redness and pain at the surgery site, drainage, fever, or pain not relieved by medication.    4) Additional Instructions:        Please contact your physician with any problems or Same Day Surgery at [phone], Monday through Friday 6 am to 4 pm, or [place] [facility] Main number at [phone].

## 2018-01-11 NOTE — Anesthesia Postprocedure Evaluation (Signed)
Anesthesia Post Note  Patient: Angela Mckee  Procedure(s) Performed: DILATATION AND CURETTAGE /HYSTEROSCOPY (N/A ) RECTAL EXAM UNDER ANESTHESIA (N/A ) SPHINCTEROTOMY  Patient location during evaluation: PACU Anesthesia Type: General Level of consciousness: awake and alert Pain management: pain level controlled Vital Signs Assessment: post-procedure vital signs reviewed and stable Respiratory status: spontaneous breathing and respiratory function stable Cardiovascular status: stable Anesthetic complications: no     Last Vitals:  Vitals:   01/11/18 1027 01/11/18 1332  BP: 125/65 (!) 131/98  Pulse: (!) 51   Resp: 16 20  Temp: 36.8 C   SpO2: 99% 93%    Last Pain:  Vitals:   01/11/18 1332  TempSrc:   PainSc: 8                  KEPHART,WILLIAM K

## 2018-01-11 NOTE — OR Nursing (Signed)
Dr. Katrinka BlazingSmith in to see pt @ 1520

## 2018-01-11 NOTE — H&P (Signed)
She was seen in the preop holding area.  She had been scheduled for a D&C by Dr. Elesa MassedWard.  Also last week she had internal hemorrhoid rubber band ligation.  No fissure was found at that time or in the preoperative examination.  She reported continued pain in the anal area.  Also she reports that she saw 1 rubber band come out into her commode about 6 hours after surgery.  She also saw another rubber band the next day.  She reports persistent anal pain.  On examination there was no dermatitis.  Mild traction on the anoderm found exquisite tenderness.  Rectal exam under anesthesia was recommended and also discussed potential need for anorectal surgery.

## 2018-01-11 NOTE — Transfer of Care (Signed)
Immediate Anesthesia Transfer of Care Note  Patient: Angela Mckee  Procedure(s) Performed: DILATATION AND CURETTAGE /HYSTEROSCOPY (N/A ) RECTAL EXAM UNDER ANESTHESIA (N/A ) SPHINCTEROTOMY  Patient Location: PACU  Anesthesia Type:General  Level of Consciousness: awake, alert  and oriented  Airway & Oxygen Therapy: Patient Spontanous Breathing and Patient connected to face mask oxygen  Post-op Assessment: Report given to RN and Post -op Vital signs reviewed and stable  Post vital signs: Reviewed and stable  Last Vitals:  Vitals:   01/11/18 1027 01/11/18 1332  BP: 125/65 (!) 131/98  Pulse: (!) 51   Resp: 16 20  Temp: 36.8 C   SpO2: 99% 93%    Last Pain:  Vitals:   01/11/18 1027  TempSrc: Oral  PainSc: 8       Patients Stated Pain Goal: 2 (01/11/18 1027)  Complications: No apparent anesthesia complications

## 2018-01-11 NOTE — Anesthesia Procedure Notes (Signed)
Procedure Name: LMA Insertion Date/Time: 01/11/2018 12:15 PM Performed by: Omer JackWeatherly, Sianna Garofano, CRNA Pre-anesthesia Checklist: Patient identified, Patient being monitored, Timeout performed, Emergency Drugs available and Suction available Patient Re-evaluated:Patient Re-evaluated prior to induction Oxygen Delivery Method: Circle system utilized Preoxygenation: Pre-oxygenation with 100% oxygen Induction Type: IV induction Ventilation: Mask ventilation without difficulty LMA: LMA inserted LMA Size: 3.5 Tube type: Oral Number of attempts: 1 Placement Confirmation: positive ETCO2 and breath sounds checked- equal and bilateral Tube secured with: Tape Dental Injury: Teeth and Oropharynx as per pre-operative assessment

## 2018-01-11 NOTE — Anesthesia Post-op Follow-up Note (Signed)
Anesthesia QCDR form completed.        

## 2018-01-11 NOTE — Anesthesia Preprocedure Evaluation (Addendum)
Anesthesia Evaluation  Patient identified by MRN, date of birth, ID band Patient awake    Reviewed: Allergy & Precautions, NPO status , Patient's Chart, lab work & pertinent test results, reviewed documented beta blocker date and time   History of Anesthesia Complications (+) PONV and history of anesthetic complications  Airway Mallampati: III  TM Distance: >3 FB     Dental  (+) Chipped   Pulmonary former smoker,           Cardiovascular      Neuro/Psych  Headaches, Anxiety    GI/Hepatic GERD  Controlled,  Endo/Other  Morbid obesity  Renal/GU      Musculoskeletal   Abdominal   Peds  Hematology   Anesthesia Other Findings Heart rate usually low. Can take dilaudid.  Reproductive/Obstetrics                            Anesthesia Physical Anesthesia Plan  ASA: III  Anesthesia Plan: General   Post-op Pain Management:    Induction:   PONV Risk Score and Plan:   Airway Management Planned: LMA  Additional Equipment:   Intra-op Plan:   Post-operative Plan:   Informed Consent: I have reviewed the patients History and Physical, chart, labs and discussed the procedure including the risks, benefits and alternatives for the proposed anesthesia with the patient or authorized representative who has indicated his/her understanding and acceptance.     Plan Discussed with: CRNA  Anesthesia Plan Comments:         Anesthesia Quick Evaluation

## 2018-01-11 NOTE — H&P (Signed)
Preoperative History and Physical  Angela Mckee is a 27 y.o. with abnormal uterine bleeding and thickened endometrium.   No significant preoperative concerns.  Proposed surgery: Dilation and curettage, hysteroscopy   Past Medical History:  Diagnosis Date  . Bradycardia    PT STATES THAT AT DR Lacretia Nicks [facility] OFFICE Dec 25, 2017 THAT HER PULSE WAS 49-PT STATES SHE DOES HAVE OCC DIZZINESS.  HER LAST PULSE CHECKED IN DR WARDS OFFICE ON 01-04-18 WAS 80.  PT ASYMPTOMATIC OTHERWISE  . Complication of anesthesia    HARD TO WAKE UP  . GERD (gastroesophageal reflux disease)    NO MEDS  . Headache    MIGRAINES  . Hemorrhoids    PT HAVING HEMORRHOID BANDING IN OFFICE ON 01-07-18  . History of kidney stones    H/O  . Panic attack   . PONV (postoperative nausea and vomiting)    Past Surgical History:  Procedure Laterality Date  . ADENOIDECTOMY    . CESAREAN SECTION     X2  . CHOLECYSTECTOMY    . 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 TONSILLECTOMY     OB History  No data available  Patient denies any other pertinent gynecologic issues.   No current facility-administered medications on file prior to encounter.    Current Outpatient Medications on File Prior to Encounter  Medication Sig Dispense Refill  . HYDROcodone-acetaminophen (NORCO/VICODIN) 5-325 MG tablet Take 1 tablet by mouth every 6 (six) hours as needed for moderate pain.   0  . ibuprofen (ADVIL,MOTRIN) 200 MG tablet Take 400 mg by mouth every 6 (six) hours as needed for headache or moderate pain.    Marland Kitchen OVER THE COUNTER MEDICATION Apply 1 application topically as needed (for BM). [place] Ointment    . chlorpheniramine-HYDROcodone (TUSSIONEX PENNKINETIC ER) 10-8 MG/5ML SUER Take 5 mLs by mouth 2 (two) times daily. (Patient not taking: Reported on 01/01/2018) 140 mL 0  . fexofenadine-pseudoephedrine (ALLEGRA-D ALLERGY & CONGESTION) 180-240 MG 24 hr tablet  Take 1 tablet by mouth daily. (Patient not taking: Reported on 01/01/2018) 30 tablet 0  . fluticasone (FLONASE) 50 MCG/ACT nasal spray Place 2 sprays into both nostrils daily. (Patient not taking: Reported on 01/01/2018) 16 g 0   Allergies  Allergen Reactions  . Morphine And Related Hives, Shortness Of Breath and Swelling    Social History:   reports that she quit smoking about 5 months ago. Her smoking use included cigarettes. She has a 2.00 pack-year smoking history. she has never used smokeless tobacco. She reports that she drinks alcohol. She reports that she does not use drugs.  Family history: obseity, alzheimer's, anxiety/depression, breast and skin cancer, asthma  Review of Systems: Noncontributory  PHYSICAL EXAM: Blood pressure 125/65, pulse (!) 51, temperature 98.3 F (36.8 C), temperature source Oral, resp. rate 16, last menstrual period 01/05/2018, SpO2 99 %. General appearance - alert, well appearing, and in no distress Chest - clear to auscultation, no wheezes, rales or rhonchi, symmetric air entry Heart - normal rate and regular rhythm Abdomen - soft, nontender, nondistended, no masses or organomegaly Pelvic - examination not indicated Extremities - peripheral pulses normal, no pedal edema, no clubbing or cyanosis  Labs: Results for orders placed or performed during the hospital encounter of 01/11/18 (from the past 336 hour(s))  Pregnancy, urine POC   Collection Time: 01/11/18 10:29 AM  Result Value Ref Range   Preg Test, Ur  NEGATIVE NEGATIVE  ABO/Rh   Collection Time: 01/11/18 10:55 AM  Result Value Ref Range   ABO/RH(D)      AB POS Performed at [facility], [address] [place], [place] [address]   Results for orders placed or performed during the hospital encounter of 01/07/18 (from the past 336 hour(s))  Basic metabolic panel   Collection Time: 01/07/18  2:42 PM  Result Value Ref Range   Sodium 141 135 - 145 mmol/L   Potassium 3.2 (L) 3.5 - 5.1  mmol/L   Chloride 105 101 - 111 mmol/L   CO2 25 22 - 32 mmol/L   Glucose, Bld 84 65 - 99 mg/dL   BUN 10 6 - 20 mg/dL   Creatinine, Ser 6.040.53 0.44 - 1.00 mg/dL   Calcium 9.4 8.9 - 54.010.3 mg/dL   GFR calc non Af Amer >60 >60 mL/min   GFR calc Af Amer >60 >60 mL/min   Anion gap 11 5 - 15  CBC   Collection Time: 01/07/18  2:42 PM  Result Value Ref Range   WBC 8.2 3.6 - 11.0 K/uL   RBC 4.86 3.80 - 5.20 MIL/uL   Hemoglobin 14.4 12.0 - 16.0 g/dL   HCT 98.143.2 19.135.0 - 47.847.0 %   MCV 89.0 80.0 - 100.0 fL   MCH 29.7 26.0 - 34.0 pg   MCHC 33.4 32.0 - 36.0 g/dL   RDW 29.513.0 62.111.5 - 30.814.5 %   Platelets 189 150 - 440 K/uL  Type and screen [facility]   Collection Time: 01/07/18  2:42 PM  Result Value Ref Range   ABO/RH(D) AB POS    Antibody Screen NEG    Sample Expiration 01/21/2018    Extend sample reason      NO TRANSFUSIONS OR PREGNANCY IN THE PAST 3 MONTHS Performed at [facility], [address] [place], [place] [address]     Assessment: Patient Active Problem List   Diagnosis Date Noted  . Dysfunctional uterine bleeding 01/11/2018    Plan: Patient will undergo surgical management with dilation and curettage, hysteroscopy.   The risks of surgery were discussed in detail with the patient including but not limited to: bleeding which may require transfusion or reoperation; infection which may require antibiotics; injury to surrounding organs which may involve bowel, bladder, ureters ; need for additional procedures including laparoscopy or laparotomy; thromboembolic phenomenon, surgical site problems and other postoperative/anesthesia complications. Likelihood of success in alleviating the patient's condition was discussed. Routine postoperative instructions will be reviewed with the patient and her family in detail after surgery.  The patient concurred with the proposed plan, giving informed written consent for the surgery.  Patient has been NPO since last night she  will remain NPO for procedure.  Anesthesia and OR aware.   To OR when ready.  ----- Ranae Plumberhelsea Ward, MD Attending Obstetrician and Gynecologist [facility], Department of OB/[facility]N [facility]

## 2018-01-12 ENCOUNTER — Encounter: Payer: Self-pay | Admitting: Obstetrics & Gynecology

## 2018-01-12 LAB — SURGICAL PATHOLOGY

## 2018-01-22 NOTE — Op Note (Signed)
Operative Report Hysteroscopy, Dilation and Curettage 01/11/2018  Patient:  Angela Mckee  27 y.o. female Preoperative diagnosis:  DUB Postoperative diagnosis:  DUB  PROCEDURE:  Procedure(s): DILATATION AND CURETTAGE /HYSTEROSCOPY (N/A) RECTAL EXAM UNDER ANESTHESIA (N/A) SPHINCTEROTOMY Surgeon:  Surgeon(s) and Role:    * Misako Roeder, Elenora Fenderhelsea C, MD - Primary Anesthesia:  LMA I/O: 500cc, out 15cc EBL Specimens:  Endometrial curettings Complications: None Apparent Disposition:  VS stable to PACU  Findings: Uterus, mobile, normal size, sounding to 8cm; normal cervix, vagina, perineum.  Hysteroscopy: fluffy endometrium  Indication for procedure/Consents: 27 y.o. here for scheduled surgery for the aforementioned diagnoses.  Risks of surgery were discussed with the patient including but not limited to: bleeding which may require transfusion; infection which may require antibiotics; injury to uterus or surrounding organs; intrauterine scarring which may impair future fertility; need for additional procedures including laparotomy or laparoscopy; and other postoperative/anesthesia complications. Written informed consent was obtained.    Procedure Details:   The patient was then taken to the operating room where anesthesia was administered and was found to be adequate.  After a formal timeout was performed, she was placed in the dorsal lithotomy position and examined with the above findings. She was then prepped and draped in the sterile manner.  A speculum was then placed in the patient's vagina and a single tooth tenaculum was applied to the anterior lip of the cervix.  The uterus was sounded to 8cm. Her cervix was serially dilated to accommodate the myoscope, with findings as above. A sharp curettage was then performed until there was a gritty texture in all four quadrants. The specimen was handed off to nursing.  The camera was reinserted and confirmed the uterus had been evacuated. The tenaculum  was removed from the anterior lip of the cervix and the vaginal speculum was removed after noting good hemostasis.  Dr. Katrinka BlazingSmith then scrubbed and assumed care for this patient for his procedure.    The patient will be discharged to home as per PACU criteria.  Routine postoperative instructions given. She will follow up in the clinic in two to four weeks for postoperative evaluation.  Ranae Plumberhelsea Treshun Wold, MD [facility] OBGYN Attending Gynecologist

## 2018-02-24 ENCOUNTER — Ambulatory Visit: Admission: RE | Admit: 2018-02-24 | Payer: Self-pay | Source: Ambulatory Visit | Admitting: Unknown Physician Specialty

## 2018-02-24 ENCOUNTER — Encounter: Admission: RE | Payer: Self-pay | Source: Ambulatory Visit

## 2018-02-24 SURGERY — COLONOSCOPY WITH PROPOFOL
Anesthesia: General

## 2018-04-30 ENCOUNTER — Other Ambulatory Visit: Payer: Self-pay | Admitting: Obstetrics & Gynecology

## 2018-04-30 DIAGNOSIS — M549 Dorsalgia, unspecified: Secondary | ICD-10-CM

## 2018-05-03 ENCOUNTER — Ambulatory Visit
Admission: RE | Admit: 2018-05-03 | Discharge: 2018-05-03 | Disposition: A | Discharge status: Home / Self Care | Payer: BC Managed Care – PPO | Source: Ambulatory Visit | Attending: Obstetrics & Gynecology | Admitting: Obstetrics & Gynecology

## 2018-11-08 ENCOUNTER — Other Ambulatory Visit: Payer: Self-pay

## 2018-11-08 ENCOUNTER — Encounter: Payer: Self-pay | Admitting: Emergency Medicine

## 2018-11-08 ENCOUNTER — Ambulatory Visit
Admission: EM | Admit: 2018-11-08 | Discharge: 2018-11-08 | Disposition: A | Discharge status: Home / Self Care | Payer: Medicaid Other | Attending: Family Medicine | Admitting: Family Medicine

## 2018-11-08 DIAGNOSIS — H1033 Unspecified acute conjunctivitis, bilateral: Secondary | ICD-10-CM | POA: Diagnosis not present

## 2018-11-08 MED ORDER — MOXIFLOXACIN HCL 0.5 % OP SOLN: 1.0000 [drp] | Freq: Three times a day (TID) | OPHTHALMIC | 0 refills | Status: AC

## 2018-11-08 NOTE — Discharge Instructions (Addendum)
Take medication as prescribed. Rest. Drink plenty of fluids.  Avoid rubbing eyes.  Continue allergy medicine.  Follow up with your primary care physician this week as needed. Return to Urgent care for new or worsening concerns.

## 2018-11-08 NOTE — ED Provider Notes (Signed)
[facility] ____________________________________________  Time seen: Approximately 8:44 AM  I have reviewed the triage vital signs and the nursing notes.   HISTORY  Chief Complaint Eye Drainage and Eye Problem   HPI Angela Mckee is a 27 y.o. female presenting for evaluation of right eye redness, irritation and drainage present for the last 3 days gradual in onset.  States also now today feeling some itching to left eye and some drainage as well.  States that she intermittently has seasonal allergies and thought this was going on initially as she had some nasal congestion.  States however her eyes were very itchy and she continued to arrive and then noticed increase of greenish drainage.  States some light sensitivity.  Occasional blurry vision but no vision loss.  Denies foreign body sensation.  Denies trauma.  Does state that feels like sandy gritty feeling to her right eye.  Does not wear glasses or contacts.  No fevers, sore throat chest pain or shortness of breath.  Continue to eat and drink well.  Has tried over-the-counter eyedrops as well as some leftover eyedrops from her children without resolution.  Denies other aggravating alleviating factors.  Was otherwise doing well.  No LMP recorded. Patient has had an implant.denies pregnancy.  [place]: PCP     Past Medical History:  Diagnosis Date  . Bradycardia    PT STATES THAT AT DR Lacretia Nicks [facility] OFFICE Dec 25, 2017 THAT HER PULSE WAS 49-PT STATES SHE DOES HAVE OCC DIZZINESS.  HER LAST PULSE CHECKED IN DR WARDS OFFICE ON 01-04-18 WAS 80.  PT ASYMPTOMATIC OTHERWISE  . Complication of anesthesia    HARD TO WAKE UP  . GERD (gastroesophageal reflux disease)    NO MEDS  . Headache    MIGRAINES  . Hemorrhoids    PT HAVING HEMORRHOID BANDING IN OFFICE ON 01-07-18  . History of kidney stones    H/O  . Panic attack   . PONV (postoperative nausea and vomiting)     Patient Active Problem List   Diagnosis  Date Noted  . Dysfunctional uterine bleeding 01/11/2018    Past Surgical History:  Procedure Laterality Date  . ADENOIDECTOMY    . CESAREAN SECTION     X2  . CHOLECYSTECTOMY    . HYSTEROSCOPY W/D&C N/A 01/11/2018   Procedure: DILATATION AND CURETTAGE /HYSTEROSCOPY;  Surgeon: Ward, Elenora Fender, MD;  Location: [facility] OR[facility];  Service: Gynecology;  Laterality: N/A;  . 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 RECTAL EXAM UNDER ANESTHESIA N/A 01/11/2018   Procedure: RECTAL EXAM UNDER ANESTHESIA;  Surgeon: Ward, Elenora Fender, MD;  Location: [facility];  Service: Gynecology;  Laterality: N/A;  . SPHINCTEROTOMY  01/11/2018   Procedure: SPHINCTEROTOMY;  Surgeon: Ward, Elenora Fender, MD;  Location: [facility] OR[facility];  Service: Gynecology;;  . TONSILLECTOMY       No current facility-administered medications for this encounter.   Current Outpatient Medications:  .  fexofenadine-pseudoephedrine (ALLEGRA-D ALLERGY & CONGESTION) 180-240 MG 24 hr tablet, Take 1 tablet by mouth daily., Disp: 30 tablet, Rfl: 0 .  fluticasone (FLONASE) 50 MCG/ACT nasal spray, Place 2 sprays into both nostrils daily., Disp: 16 g, Rfl: 0 .  moxifloxacin (VIGAMOX) 0.5 % ophthalmic solution, Place 1 drop into both eyes 3 (three) times daily for 7 days., Disp: 3 mL, Rfl: 0 .  OVER THE COUNTER MEDICATION, Apply 1 application topically as needed (for  BM). Rawleighs Ointment, Disp: , Rfl:   Allergies Morphine and related  History reviewed. No pertinent family history.  Social History Social History   Tobacco Use  . Smoking status: Former Smoker    Packs/day: 0.25    Years: 8.00    Pack years: 2.00    Types: Cigarettes    Last attempt to quit: 08/05/2017    Years since quitting: 1.2  . Smokeless tobacco: Never Used  . Tobacco comment: PT STATES SHE STILL WILL SMOKE OCC  Substance Use Topics  . Alcohol use: Yes    Comment: OCC  . Drug use: No    Review of  Systems Constitutional: No fever Eyes: as above.  ENT: No sore throat. Cardiovascular: Denies chest pain. Respiratory: Denies shortness of breath. Musculoskeletal: Negative for back pain. Skin: Negative for rash.  ____________________________________________   PHYSICAL EXAM:  VITAL SIGNS: ED Triage Vitals  Enc Vitals Group     BP 11/08/18 0824 106/70     Pulse Rate 11/08/18 0824 60     Resp 11/08/18 0824 18     Temp 11/08/18 0824 98.4 F (36.9 C)     Temp Source 11/08/18 0824 Oral     SpO2 11/08/18 0824 99 %     Weight 11/08/18 0821 185 lb (83.9 kg)     Height 11/08/18 0821 5\' 2"  (1.575 m)     Head Circumference --      Peak Flow --      Pain Score 11/08/18 0821 0     Pain Loc --      Pain Edu? --      Excl. in GC? --      Visual Acuity  Right Eye Distance: 20/20 uncorrected Left Eye Distance: 20/20 uncorrected Bilateral Distance: 20/20 uncorrected    Constitutional: Alert and oriented. Well appearing and in no acute distress. Eyes: Mild diffuse conjunctival right injection.  Minimal left conjunctival injection.  Copious right greenish eye drainage from right eye, scant from left.  PERRL. EOMI. no pain with EOMs.  No surrounding tenderness, swelling or erythema bilaterally.  Bilateral eyes examined with fluorescein and tetracaine, no corneal abrasion or dye uptake noted bilaterally.  ENT      Head: Normocephalic and atraumatic.      Ears: Nontender, normal canal, no erythema, normal TMs bilaterally.      Nose: No congestion/rhinnorhea.      Mouth/Throat: Mucous membranes are moist.Oropharynx non-erythematous. Cardiovascular: Normal rate, regular rhythm. Grossly normal heart sounds.  Good peripheral circulation. Respiratory: Normal respiratory effort without tachypnea nor retractions. Breath sounds are clear and equal bilaterally. No wheezes, rales, rhonchi. Musculoskeletal: Steady gait. Neurologic:  Normal speech and language.  Speech is normal. No gait instability.   Skin:  Skin is warm, dry and intact. No rash noted. Psychiatric: Mood and affect are normal. Speech and behavior are normal. Patient exhibits appropriate insight and judgment   ___________________________________________   LABS (all labs ordered are listed, but only abnormal results are displayed)  Labs Reviewed - No data to display ____________________________________________   PROCEDURES Procedures   Eye exam Procedure explained and verbal consent obtained.  Anesthesia: tetracaine ophthalmic 2 drops both eye examined with fluorescein strip.  No foreign bodies visualized. No corneal abrasion noted.  Patient tolerated well.    INITIAL IMPRESSION / ASSESSMENT AND PLAN / ED COURSE  Pertinent labs & imaging results that were available during my care of the patient were reviewed by me and considered in my medical decision making (see chart  for details).  Well-appearing patient.  No acute distress.  Suspect bacterial conjunctivitis.  Will treat with Vigamox.  Encourage supportive care.Discussed indication, risks and benefits of medications with patient.  Discussed follow up with Primary care physician this week. Discussed follow up and return parameters including no resolution or any worsening concerns. Patient verbalized understanding and agreed to plan.   ____________________________________________   FINAL CLINICAL IMPRESSION(S) / ED DIAGNOSES  Final diagnoses:  Acute bacterial conjunctivitis of both eyes     ED Discharge Orders         Ordered    moxifloxacin (VIGAMOX) 0.5 % ophthalmic solution  3 times daily     11/08/18 0858           Note: This dictation was prepared with Dragon dictation along with smaller phrase technology. Any transcriptional errors that result from this process are unintentional.         Renford DillsMiller, Rehman Levinson, NP 11/08/18 1002

## 2018-11-08 NOTE — ED Triage Notes (Signed)
Patient c/o eye itching and drainage that started on Friday. Patient stated she used a prescription eye drop on Saturday for pink eye with no relief.

## 2018-12-01 ENCOUNTER — Ambulatory Visit
Admission: EM | Admit: 2018-12-01 | Discharge: 2018-12-01 | Disposition: A | Discharge status: Home / Self Care | Payer: Medicaid Other | Attending: Family Medicine | Admitting: Family Medicine

## 2018-12-01 ENCOUNTER — Other Ambulatory Visit: Payer: Self-pay

## 2018-12-01 DIAGNOSIS — H6691 Otitis media, unspecified, right ear: Secondary | ICD-10-CM

## 2018-12-01 DIAGNOSIS — B9789 Other viral agents as the cause of diseases classified elsewhere: Secondary | ICD-10-CM

## 2018-12-01 DIAGNOSIS — J069 Acute upper respiratory infection, unspecified: Secondary | ICD-10-CM

## 2018-12-01 DIAGNOSIS — Z885 Allergy status to narcotic agent status: Secondary | ICD-10-CM | POA: Insufficient documentation

## 2018-12-01 DIAGNOSIS — Z79899 Other long term (current) drug therapy: Secondary | ICD-10-CM | POA: Insufficient documentation

## 2018-12-01 DIAGNOSIS — Z87891 Personal history of nicotine dependence: Secondary | ICD-10-CM

## 2018-12-01 LAB — RAPID STREP SCREEN (MED CTR MEBANE ONLY): Streptococcus, Group A Screen (Direct): NEGATIVE

## 2018-12-01 MED ORDER — AMOXICILLIN 875 MG PO TABS: 875.0000 mg | ORAL_TABLET | Freq: Two times a day (BID) | ORAL | 0 refills | Status: DC

## 2018-12-01 NOTE — ED Provider Notes (Signed)
[facility] ____________________________________________  Time seen: Approximately 8:50 AM  I have reviewed the triage vital signs and the nursing notes.   HISTORY  Chief Complaint Sore Throat   HPI Angela Mckee is a 27 y.o. female presenting for evaluation of 5 days of sore throat, ear pain, nasal congestion, cough.  States feeling postnasal drainage as well as coughing up greenish mucus.  States feels tired.  States cough does intermittently disrupt sleep.  Denies fevers.  Overall continues to eat and drink well.  Sore throat currently is mild, intermittently moderate.  States her son recently sick with similar complaints just prior to her onset.  Denies other known direct sick contacts besides work.  States currently sinus pain and ear discomfort is moderate.  States both ears hurt but right has been hurting more.  Has taken some over-the-counter Sudafed, continue her normal allergy regimen of Flonase, Allegra and sometimes Benadryl.  Occasionally taken flu medicine over-the-counter.  Denies chest pain or shortness of breath.  Denies other aggravating alleviating factors.  Was otherwise doing well.  [place]: PCP No LMP recorded. Patient has had an implant.denies pregnancy.      Past Medical History:  Diagnosis Date  . Bradycardia    PT STATES THAT AT DR Lacretia Nicks [facility] OFFICE Dec 25, 2017 THAT HER PULSE WAS 49-PT STATES SHE DOES HAVE OCC DIZZINESS.  HER LAST PULSE CHECKED IN DR WARDS OFFICE ON 01-04-18 WAS 80.  PT ASYMPTOMATIC OTHERWISE  . Complication of anesthesia    HARD TO WAKE UP  . GERD (gastroesophageal reflux disease)    NO MEDS  . Headache    MIGRAINES  . Hemorrhoids    PT HAVING HEMORRHOID BANDING IN OFFICE ON 01-07-18  . History of kidney stones    H/O  . Panic attack   . PONV (postoperative nausea and vomiting)     Patient Active Problem List   Diagnosis Date Noted  . Dysfunctional uterine bleeding 01/11/2018    Past Surgical  History:  Procedure Laterality Date  . ADENOIDECTOMY    . CESAREAN SECTION     X2  . CHOLECYSTECTOMY    . HYSTEROSCOPY W/D&C N/A 01/11/2018   Procedure: DILATATION AND CURETTAGE /HYSTEROSCOPY;  Surgeon: Ward, Elenora Fender, MD;  Location: [facility];  Service: Gynecology;  Laterality: N/A;  . 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 RECTAL EXAM UNDER ANESTHESIA N/A 01/11/2018   Procedure: RECTAL EXAM UNDER ANESTHESIA;  Surgeon: Ward, Elenora Fender, MD;  Location: [facility];  Service: Gynecology;  Laterality: N/A;  . SPHINCTEROTOMY  01/11/2018   Procedure: SPHINCTEROTOMY;  Surgeon: Ward, Elenora Fender, MD;  Location: [facility];  Service: Gynecology;;  . TONSILLECTOMY       No current facility-administered medications for this encounter.   Current Outpatient Medications:  .  fluticasone (FLONASE) 50 MCG/ACT nasal spray, Place 2 sprays into both nostrils daily., Disp: 16 g, Rfl: 0 .  loratadine (CLARITIN) 10 MG tablet, Take 10 mg by mouth daily., Disp: , Rfl:  .  amoxicillin (AMOXIL) 875 MG tablet, Take 1 tablet (875 mg total) by mouth 2 (two) times daily., Disp: 20 tablet, Rfl: 0 .  OVER THE COUNTER MEDICATION, Apply 1 application topically as needed (for BM). Rawleighs Ointment, Disp: , Rfl:   Allergies Morphine and related  History reviewed. No pertinent family history.  Social History Social History   Tobacco Use  . Smoking  status: Former Smoker    Packs/day: 0.25    Years: 8.00    Pack years: 2.00    Types: Cigarettes    Last attempt to quit: 08/05/2017    Years since quitting: 1.3  . Smokeless tobacco: Never Used  . Tobacco comment: PT STATES SHE STILL WILL SMOKE OCC  Substance Use Topics  . Alcohol use: Yes    Comment: OCC  . Drug use: No    Review of Systems Constitutional: No fever ENT: As above.  Cardiovascular: Denies chest pain. Respiratory: Denies shortness of breath. Gastrointestinal: No  abdominal pain.    Skin: Negative for rash. ____________________________________________   PHYSICAL EXAM:  VITAL SIGNS: ED Triage Vitals  Enc Vitals Group     BP 12/01/18 0826 121/70     Pulse Rate 12/01/18 0826 (!) 51     Resp 12/01/18 0826 18     Temp 12/01/18 0826 98.4 F (36.9 C)     Temp Source 12/01/18 0826 Oral     SpO2 12/01/18 0826 100 %     Weight 12/01/18 0824 185 lb (83.9 kg)     Height 12/01/18 0824 5\' 2"  (1.575 m)     Head Circumference --      Peak Flow --      Pain Score 12/01/18 0824 8     Pain Loc --      Pain Edu? --      Excl. in GC? --    Constitutional: Alert and oriented. Well appearing and in no acute distress. Eyes: Conjunctivae are normal.  Head: Atraumatic. No sinus tenderness to palpation. No swelling. No erythema.  Ears: Left: nontender, normal canal, mild erythema, otherwise normal TM. Right: nontender, normal canal, moderate erythema, dull TM.  Nose:Nasal congestion   Mouth/Throat: Mucous membranes are moist. Mild pharyngeal erythema. No tonsillar swelling or exudate.  Neck: No stridor.  No cervical spine tenderness to palpation. Hematological/Lymphatic/Immunilogical: No cervical lymphadenopathy. Cardiovascular: Normal rate, regular rhythm. Grossly normal heart sounds.  Good peripheral circulation. Respiratory: Normal respiratory effort.  No retractions. No wheezes, rales or rhonchi. Good air movement.  Musculoskeletal: Ambulatory with steady gait.  Neurologic:  Normal speech and language. No gait instability. Skin:  Skin appears warm, dry and intact. No rash noted. Psychiatric: Mood and affect are normal. Speech and behavior are normal.  ______________________________________   LABS (all labs ordered are listed, but only abnormal results are displayed)  Labs Reviewed  RAPID STREP SCREEN ([facility] ONLY)  CULTURE, GROUP A STREP [facility])   ____________________________________________  PROCEDURES Procedures   INITIAL IMPRESSION /  ASSESSMENT AND PLAN / ED COURSE  Pertinent labs & imaging results that were available during my care of the patient were reviewed by me and considered in my medical decision making (see chart for details).  Overall well-appearing patient.  No acute distress.  Lungs clear throughout.  Quick strep negative, will culture.  Patient does have right otitis.  Will treat with oral amoxicillin, suspect viral illness otherwise.  Encourage over-the-counter cough congestion medications, rest, fluids, supportive care.  Work note given for today and tomorrow.Discussed indication, risks and benefits of medications with patient.  Discussed follow up with Primary care physician this week. Discussed follow up and return parameters including no resolution or any worsening concerns. Patient verbalized understanding and agreed to plan.   ____________________________________________   FINAL CLINICAL IMPRESSION(S) / ED DIAGNOSES  Final diagnoses:  Viral URI with cough  Right otitis media, unspecified otitis media type  ED Discharge Orders         Ordered    amoxicillin (AMOXIL) 875 MG tablet  2 times daily     12/01/18 0846           Note: This dictation was prepared with Dragon dictation along with smaller phrase technology. Any transcriptional errors that result from this process are unintentional.         Renford DillsMiller, Cleon Thoma, NP 12/01/18 [phone]

## 2018-12-01 NOTE — ED Triage Notes (Signed)
Patient complains of sore throat, cough and ear pain with body aches since Friday.

## 2018-12-01 NOTE — Discharge Instructions (Addendum)
Take medication as prescribed. Rest. Drink plenty of fluids.  ° °Follow up with your primary care physician this week as needed. Return to Urgent care for new or worsening concerns.  ° °

## 2018-12-04 LAB — CULTURE, GROUP A STREP (THRC)

## 2018-12-12 ENCOUNTER — Emergency Department: Payer: Medicaid Other

## 2018-12-12 ENCOUNTER — Other Ambulatory Visit: Payer: Self-pay

## 2018-12-12 ENCOUNTER — Emergency Department
Admission: EM | Admit: 2018-12-12 | Discharge: 2018-12-12 | Disposition: A | Discharge status: Home / Self Care | Payer: Medicaid Other | Attending: Emergency Medicine | Admitting: Emergency Medicine

## 2018-12-12 DIAGNOSIS — Z87891 Personal history of nicotine dependence: Secondary | ICD-10-CM | POA: Diagnosis not present

## 2018-12-12 DIAGNOSIS — N809 Endometriosis, unspecified: Secondary | ICD-10-CM | POA: Diagnosis not present

## 2018-12-12 DIAGNOSIS — M545 Low back pain: Secondary | ICD-10-CM | POA: Diagnosis not present

## 2018-12-12 DIAGNOSIS — R102 Pelvic and perineal pain: Secondary | ICD-10-CM | POA: Insufficient documentation

## 2018-12-12 DIAGNOSIS — R1030 Lower abdominal pain, unspecified: Secondary | ICD-10-CM | POA: Diagnosis present

## 2018-12-12 LAB — COMPREHENSIVE METABOLIC PANEL
ALK PHOS: 83 U/L (ref 38–126)
ALT: 31 U/L (ref 0–44)
ANION GAP: 7 (ref 5–15)
AST: 25 U/L (ref 15–41)
Albumin: 4.4 g/dL (ref 3.5–5.0)
BILIRUBIN TOTAL: 1 mg/dL (ref 0.3–1.2)
BUN: 8 mg/dL (ref 6–20)
CALCIUM: 9.4 mg/dL (ref 8.9–10.3)
CO2: 23 mmol/L (ref 22–32)
Chloride: 111 mmol/L (ref 98–111)
Creatinine, Ser: 0.49 mg/dL (ref 0.44–1.00)
GFR calc Af Amer: >60 mL/min (ref >60)
Glucose, Bld: 80 mg/dL (ref 70–99)
POTASSIUM: 3.6 mmol/L (ref 3.5–5.1)
Sodium: 141 mmol/L (ref 135–145)
TOTAL PROTEIN: 7.5 g/dL (ref 6.5–8.1)

## 2018-12-12 LAB — CBC
HCT: 44.9 % (ref 36.0–46.0)
HEMOGLOBIN: 14.7 g/dL (ref 12.0–15.0)
MCH: 30.3 pg (ref 26.0–34.0)
MCHC: 32.7 g/dL (ref 30.0–36.0)
MCV: 92.6 fL (ref 80.0–100.0)
PLATELETS: 208 10*3/uL (ref 150–400)
RBC: 4.85 MIL/uL (ref 3.87–5.11)
RDW: 11.8 % (ref 11.5–15.5)
WBC: 8 10*3/uL (ref 4.0–10.5)
nRBC: 0 % (ref 0.0–0.2)

## 2018-12-12 LAB — URINALYSIS, COMPLETE (UACMP) WITH MICROSCOPIC
Bacteria, UA: NONE SEEN
Bilirubin Urine: NEGATIVE
GLUCOSE, UA: NEGATIVE mg/dL
Ketones, ur: NEGATIVE mg/dL
Leukocytes, UA: NEGATIVE
NITRITE: NEGATIVE
PH: 5 (ref 5.0–8.0)
Protein, ur: NEGATIVE mg/dL
SPECIFIC GRAVITY, URINE: 1.023 (ref 1.005–1.030)

## 2018-12-12 LAB — LIPASE, BLOOD: Lipase: 30 U/L (ref 11–51)

## 2018-12-12 LAB — WET PREP, GENITAL
CLUE CELLS WET PREP: NONE SEEN
SPERM: NONE SEEN
TRICH WET PREP: NONE SEEN
Yeast Wet Prep HPF POC: NONE SEEN

## 2018-12-12 LAB — CHLAMYDIA/NGC RT PCR (ARMC ONLY)
CHLAMYDIA TR: NOT DETECTED
N gonorrhoeae: NOT DETECTED

## 2018-12-12 LAB — POCT PREGNANCY, URINE: Preg Test, Ur: NEGATIVE

## 2018-12-12 MED ORDER — ONDANSETRON HCL 4 MG/2ML IJ SOLN
4.0000 mg | Freq: Once | INTRAMUSCULAR | Status: AC
  Administered 2018-12-12: 4 mg via INTRAVENOUS
  Filled 2018-12-12: qty 2

## 2018-12-12 MED ORDER — KETOROLAC TROMETHAMINE 30 MG/ML IJ SOLN
30.0000 mg | Freq: Once | INTRAMUSCULAR | Status: AC
  Administered 2018-12-12: 30 mg via INTRAVENOUS
  Filled 2018-12-12: qty 1

## 2018-12-12 NOTE — ED Notes (Addendum)
Pt refused to wait for paperwork - agreed to sign discharge. Was dressed and didn't want to have vs taken. Angry that we are not giving anything better than ultram. States her new insurance starts 12/22/18. Advised when the new insurance kicks in to see dr. Elesa MassedWard as the paperwork indicates, and that the specialist should be able to help her with this ongoing problem. Pt ambulated to exit with steady gait

## 2018-12-12 NOTE — ED Triage Notes (Signed)
Lower abd pain during night. History endometriosis with similar pain.

## 2018-12-12 NOTE — ED Provider Notes (Signed)
[facility] Emergency Department Provider Note   ____________________________________________   First MD Initiated Contact with Patient 12/12/18 [phone]     (approximate)  I have reviewed the triage vital signs and the nursing notes.   HISTORY  Chief Complaint Abdominal Pain    HPI Angela Mckee is a 27 y.o. female reports that she been experiencing lower abdominal pain that radiates to her lower back last night.  She also has been having some spotting for about 5 days.  Denies pregnancy, has Implanon.  Reports she has had the same symptoms in the past and sees Dr. Elesa Massed of OB/GYN for the same and has been diagnosed with endometriosis as cause.  She intermittently will have spotting, then discomfort in this area.  It feels like a cramping or muscle contraction-like sensation that will come and go is very severe when it last comes, but only last for maybe a few to 30 minutes at a time.  She did take a tramadol tablet last night, did not provide a whole lot relief.  No fevers or chills.  No upper abdominal pain.  Reports she is had the same symptoms many times, always related to endometriosis.   Past Medical History:  Diagnosis Date  . Bradycardia    PT STATES THAT AT DR Lacretia Nicks [facility] OFFICE Dec 25, 2017 THAT HER PULSE WAS 49-PT STATES SHE DOES HAVE OCC DIZZINESS.  HER LAST PULSE CHECKED IN DR WARDS OFFICE ON 01-04-18 WAS 80.  PT ASYMPTOMATIC OTHERWISE  . Complication of anesthesia    HARD TO WAKE UP  . GERD (gastroesophageal reflux disease)    NO MEDS  . Headache    MIGRAINES  . Hemorrhoids    PT HAVING HEMORRHOID BANDING IN OFFICE ON 01-07-18  . History of kidney stones    H/O  . Panic attack   . PONV (postoperative nausea and vomiting)     Patient Active Problem List   Diagnosis Date Noted  . Dysfunctional uterine bleeding 01/11/2018    Past Surgical History:  Procedure Laterality Date  . ADENOIDECTOMY    . CESAREAN SECTION     X2  .  CHOLECYSTECTOMY    . HYSTEROSCOPY W/D&C N/A 01/11/2018   Procedure: DILATATION AND CURETTAGE /HYSTEROSCOPY;  Surgeon: Ward, Elenora Fender, MD;  Location: [facility] ORS;  Service: Gynecology;  Laterality: N/A;  . 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 RECTAL EXAM UNDER ANESTHESIA N/A 01/11/2018   Procedure: RECTAL EXAM UNDER ANESTHESIA;  Surgeon: Ward, Elenora Fender, MD;  Location: [facility] ORS;  Service: Gynecology;  Laterality: N/A;  . SPHINCTEROTOMY  01/11/2018   Procedure: SPHINCTEROTOMY;  Surgeon: Ward, Elenora Fender, MD;  Location: [facility] ORS;  Service: Gynecology;;  . TONSILLECTOMY      Prior to Admission medications   Medication Sig Start Date End Date Taking? Authorizing Provider  amoxicillin (AMOXIL) 875 MG tablet Take 1 tablet (875 mg total) by mouth 2 (two) times daily. Patient not taking: Reported on 12/12/2018 12/01/18   Renford Dills, NP  fluticasone [facility]) 50 MCG/ACT nasal spray Place 2 sprays into both nostrils daily. 12/27/15   Lutricia Feil, PA-C    Allergies Morphine and related  No family history on file.  Social History Social History   Tobacco Use  . Smoking status: Former Smoker    Packs/day: 0.25    Years: 8.00    Pack years: 2.00    Types:  Cigarettes    Last attempt to quit: 08/05/2017    Years since quitting: 1.3  . Smokeless tobacco: Never Used  . Tobacco comment: PT STATES SHE STILL WILL SMOKE OCC  Substance Use Topics  . Alcohol use: Yes    Comment: OCC  . Drug use: No    Review of Systems Constitutional: No fever/chills Eyes: No visual changes. ENT: No sore throat. Cardiovascular: Denies chest pain. Respiratory: Denies shortness of breath. Gastrointestinal: See HPI Genitourinary: Negative for dysuria. Musculoskeletal: Negative for back pain. Skin: Negative for rash. Neurological: Negative for headaches, areas of focal weakness or  numbness.    ____________________________________________   PHYSICAL EXAM:  VITAL SIGNS: ED Triage Vitals  Enc Vitals Group     BP 12/12/18 0843 120/64     Pulse Rate 12/12/18 0843 65     Resp 12/12/18 0843 18     Temp 12/12/18 0843 98.8 F (37.1 C)     Temp Source 12/12/18 0843 Oral     SpO2 12/12/18 0843 100 %     Weight 12/12/18 0843 185 lb (83.9 kg)     Height 12/12/18 0843 5\' 2"  (1.575 m)     Head Circumference --      Peak Flow --      Pain Score 12/12/18 0851 9     Pain Loc --      Pain Edu? --      Excl. in GC? --     Constitutional: Alert and oriented.  Sitting up, appears in some pain holding her hand over her suprapubic region.  Otherwise appears well, but does appear to at least be experiencing some moderate pain which she described as contractile pain in her lower abdomen. Eyes: Conjunctivae are normal. Head: Atraumatic. Nose: No congestion/rhinnorhea. Mouth/Throat: Mucous membranes are moist. Neck: No stridor.  Cardiovascular: Normal rate, regular rhythm. Grossly normal heart sounds.  Good peripheral circulation. Respiratory: Normal respiratory effort.  No retractions. Lungs CTAB. Gastrointestinal: Soft and nontender except in the suprapubic region where she reports discomfort when palpated that seems to radiate towards her lower back. No distention.  No focal right lower quadrant pain.  Negative Murphy.  She also reports previous cholecystectomy Pelvic performed with tech Dorian.  External exam is normal.  Internal exam shows a slight bleed dark in discharge from the eyes.  No notable internal tenderness.  No abnormal odor.  No purulence. Musculoskeletal: No lower extremity tenderness nor edema. Neurologic:  Normal speech and language. No gross focal neurologic deficits are appreciated.  Skin:  Skin is warm, dry and intact. No rash noted. Psychiatric: Mood and affect are normal. Speech and behavior are normal.  ____________________________________________    LABS (all labs ordered are listed, but only abnormal results are displayed)  Labs Reviewed  WET PREP, GENITAL - Abnormal; Notable for the following components:      Result Value   WBC, Wet Prep HPF POC FEW (*)    All other components within normal limits  URINALYSIS, COMPLETE (UACMP) WITH MICROSCOPIC - Abnormal; Notable for the following components:   Color, Urine YELLOW (*)    APPearance CLEAR (*)    Hgb urine dipstick MODERATE (*)    All other components within normal limits  CHLAMYDIA/NGC RT PCR ([facility] ONLY)  LIPASE, BLOOD  COMPREHENSIVE METABOLIC PANEL  CBC  POC URINE PREG, ED  POCT PREGNANCY, URINE   ____________________________________________  EKG   ____________________________________________  RADIOLOGY  [place] Transvaginal Non-ob  Result Date: 12/12/2018 CLINICAL DATA:  Acute lower abdominopelvic  pain for 1 day EXAM: TRANSABDOMINAL AND TRANSVAGINAL ULTRASOUND OF PELVIS DOPPLER ULTRASOUND OF OVARIES TECHNIQUE: Both transabdominal and transvaginal ultrasound examinations of the pelvis were performed. Transabdominal technique was performed for global imaging of the pelvis including uterus, ovaries, adnexal regions, and pelvic cul-de-sac. It was necessary to proceed with endovaginal exam following the transabdominal exam to visualize the uterus and adnexae in better detail. Color and duplex Doppler ultrasound was utilized to evaluate blood flow to the ovaries. COMPARISON:  10/14/2009 FINDINGS: Uterus Measurements: 8.6 x 4.1 x 5.0 cm = volume: 93 mL. Nonspecific nodular contour anteriorly. Normal size and anteverted position. Endometrium Thickness: 11 mm.  No focal abnormality visualized. Right ovary Measurements: 2.3 x 1.6 x 1.7 cm = volume: 3.3 mL. Several follicles noted. Dominant follicle measures 14 mm. Left ovary Measurements: 2.7 x 2.5 x 2.5 cm = volume: 8.7 mL. Normal appearance/no adnexal mass. Pulsed Doppler evaluation of both ovaries demonstrates normal  low-resistance arterial and venous waveforms. Other findings Trace pelvic free fluid, likely physiologic IMPRESSION: No acute finding by pelvic ultrasound. Nonspecific mild uterine nodularity anteriorly. Uterus remains normal in size and anteverted. Normal appearing endometrium No ovarian or adnexal abnormality. Dominant 14 mm right ovarian follicle. Normal ovarian Doppler evaluation Electronically Signed   By: Judie PetitM.  Shick M.D.   On: 12/12/2018 11:02   [place] Pelvis Complete  Result Date: 12/12/2018 CLINICAL DATA:  Acute lower abdominopelvic pain for 1 day EXAM: TRANSABDOMINAL AND TRANSVAGINAL ULTRASOUND OF PELVIS DOPPLER ULTRASOUND OF OVARIES TECHNIQUE: Both transabdominal and transvaginal ultrasound examinations of the pelvis were performed. Transabdominal technique was performed for global imaging of the pelvis including uterus, ovaries, adnexal regions, and pelvic cul-de-sac. It was necessary to proceed with endovaginal exam following the transabdominal exam to visualize the uterus and adnexae in better detail. Color and duplex Doppler ultrasound was utilized to evaluate blood flow to the ovaries. COMPARISON:  10/14/2009 FINDINGS: Uterus Measurements: 8.6 x 4.1 x 5.0 cm = volume: 93 mL. Nonspecific nodular contour anteriorly. Normal size and anteverted position. Endometrium Thickness: 11 mm.  No focal abnormality visualized. Right ovary Measurements: 2.3 x 1.6 x 1.7 cm = volume: 3.3 mL. Several follicles noted. Dominant follicle measures 14 mm. Left ovary Measurements: 2.7 x 2.5 x 2.5 cm = volume: 8.7 mL. Normal appearance/no adnexal mass. Pulsed Doppler evaluation of both ovaries demonstrates normal low-resistance arterial and venous waveforms. Other findings Trace pelvic free fluid, likely physiologic IMPRESSION: No acute finding by pelvic ultrasound. Nonspecific mild uterine nodularity anteriorly. Uterus remains normal in size and anteverted. Normal appearing endometrium No ovarian or adnexal abnormality.  Dominant 14 mm right ovarian follicle. Normal ovarian Doppler evaluation Electronically Signed   By: Judie PetitM.  Shick M.D.   On: 12/12/2018 11:02   [place] Art/ven Flow Abd Pelv Doppler  Result Date: 12/12/2018 CLINICAL DATA:  Acute lower abdominopelvic pain for 1 day EXAM: TRANSABDOMINAL AND TRANSVAGINAL ULTRASOUND OF PELVIS DOPPLER ULTRASOUND OF OVARIES TECHNIQUE: Both transabdominal and transvaginal ultrasound examinations of the pelvis were performed. Transabdominal technique was performed for global imaging of the pelvis including uterus, ovaries, adnexal regions, and pelvic cul-de-sac. It was necessary to proceed with endovaginal exam following the transabdominal exam to visualize the uterus and adnexae in better detail. Color and duplex Doppler ultrasound was utilized to evaluate blood flow to the ovaries. COMPARISON:  10/14/2009 FINDINGS: Uterus Measurements: 8.6 x 4.1 x 5.0 cm = volume: 93 mL. Nonspecific nodular contour anteriorly. Normal size and anteverted position. Endometrium Thickness: 11 mm.  No focal abnormality visualized. Right ovary Measurements:  2.3 x 1.6 x 1.7 cm = volume: 3.3 mL. Several follicles noted. Dominant follicle measures 14 mm. Left ovary Measurements: 2.7 x 2.5 x 2.5 cm = volume: 8.7 mL. Normal appearance/no adnexal mass. Pulsed Doppler evaluation of both ovaries demonstrates normal low-resistance arterial and venous waveforms. Other findings Trace pelvic free fluid, likely physiologic IMPRESSION: No acute finding by pelvic ultrasound. Nonspecific mild uterine nodularity anteriorly. Uterus remains normal in size and anteverted. Normal appearing endometrium No ovarian or adnexal abnormality. Dominant 14 mm right ovarian follicle. Normal ovarian Doppler evaluation Electronically Signed   By: Judie Petit.  Shick M.D.   On: 12/12/2018 11:02     Ultrasound results reviewed by me, no acute findings. ____________________________________________   PROCEDURES  Procedure(s) performed:  None  Procedures  Critical Care performed: No  ____________________________________________   INITIAL IMPRESSION / ASSESSMENT AND PLAN / ED COURSE  Pertinent labs & imaging results that were available during my care of the patient were reviewed by me and considered in my medical decision making (see chart for details).   Patient presents for evaluation of lower abdominal pain.  Has an established history of endometriosis with similar symptoms, also spotting for about 5 days.  Negative pregnancy test here.  Crampy lower pain, denies any symptoms of heavy bleeding but rather reports spotting.  I would suspect this is gynecologic in etiology, but also lower abdominal discomfort such as this could also be referred from neurologic etiology.  Will check urinalysis.  Additionally, provide Toradol for pain relief, she took tramadol earlier.  Plan to obtain transvaginal ultrasound to evaluate for ovarian pathology and check pelvic examination.  No signs or symptoms suggest appendicitis, or other acute intra-abdominal etiology.  Denies any vomiting, diarrhea or other gastrointestinal symptoms.     ----------------------------------------- 10:00 AM on 12/12/2018 -----------------------------------------  Patient reports modest improvement in pain with Toradol.  Appears more comfortable.  Patient does not wish for any additional pain medicine at this time, patient received with pelvic ultrasound.  ----------------------------------------- 11:33 AM on 12/12/2018 -----------------------------------------  Patient ambulatory.  Appears improved.  Reports her pain is better.  She is requesting I provide her a prescription for stronger pain medication such as hydrocodone.  Review of her [facility] prescriber database, her score is greater than 200 and she previously identified Dr. Elesa Massed is currently assisting in management of this condition for which she has been prescribed tramadol.  I discussed with the patient,  would be happy to provide some additional tramadol but she reports she does not wish for that as she still has some at home.  Not comfortable prescribing her hydrocodone or oxycodone, discussed this with the patient. Advised her that she should follow-up and call Dr. Jolyn Nap office in the morning.  I did also send a note to Dr. Elesa Massed requesting that they contact the patient.  Patient appears well, resting comfortably no distress.  Able to get up and ambulate without trouble.  Appears appropriate for discharge.  Discussed careful return precautions including if she has persisting pain, increasing pain, develops a fever, heavy bleeding vomiting or other concerns arise to return.  Return precautions and treatment recommendations and follow-up discussed with the patient who is agreeable with the plan.  ____________________________________________   FINAL CLINICAL IMPRESSION(S) / ED DIAGNOSES  Final diagnoses:  Pelvic pain  Endometriosis        Note:  This document was prepared using Dragon voice recognition software and may include unintentional dictation errors       Sharyn Creamer, MD 12/12/18 1135

## 2018-12-31 ENCOUNTER — Encounter: Payer: Self-pay | Admitting: Emergency Medicine

## 2018-12-31 ENCOUNTER — Other Ambulatory Visit: Payer: Self-pay

## 2018-12-31 ENCOUNTER — Ambulatory Visit
Admission: EM | Admit: 2018-12-31 | Discharge: 2018-12-31 | Disposition: A | Discharge status: Home / Self Care | Payer: BLUE CROSS/BLUE SHIELD | Attending: Family Medicine | Admitting: Family Medicine

## 2018-12-31 DIAGNOSIS — R05 Cough: Secondary | ICD-10-CM

## 2018-12-31 DIAGNOSIS — R0981 Nasal congestion: Secondary | ICD-10-CM | POA: Diagnosis not present

## 2018-12-31 DIAGNOSIS — Z87891 Personal history of nicotine dependence: Secondary | ICD-10-CM

## 2018-12-31 DIAGNOSIS — J069 Acute upper respiratory infection, unspecified: Secondary | ICD-10-CM

## 2018-12-31 DIAGNOSIS — J029 Acute pharyngitis, unspecified: Secondary | ICD-10-CM

## 2018-12-31 LAB — RAPID STREP SCREEN (MED CTR MEBANE ONLY): Streptococcus, Group A Screen (Direct): NEGATIVE

## 2018-12-31 MED ORDER — BENZONATATE 200 MG PO CAPS: ORAL_CAPSULE | ORAL | 0 refills | Status: DC

## 2018-12-31 MED ORDER — FLUTICASONE PROPIONATE 50 MCG/ACT NA SUSP: 2.0000 | Freq: Every day | NASAL | 0 refills | Status: DC

## 2018-12-31 NOTE — ED Triage Notes (Signed)
Patient c/o sore throat, nausea, bilateral ear pain and nasal congestion that started 3 days ago.  Patient denies fevers.

## 2018-12-31 NOTE — Discharge Instructions (Addendum)
Flonase daily the next 2 to 3 weeks.

## 2018-12-31 NOTE — ED Provider Notes (Signed)
[facility]    CSN: [id number] Arrival date & time: 12/31/18  9735     History   Chief Complaint Chief Complaint  Patient presents with  . Sore Throat    HPI Angela Mckee is a 28 y.o. female.   HPI Female presents with sore throat nausea bilateral ear pain and nasal congestion started about 3 days ago.  She has had coughing productive of yellow-green sputum.  She has not had fever or chills.  Frontal type headache.  She is a non-smoker.       Past Medical History:  Diagnosis Date  . Bradycardia    PT STATES THAT AT DR Lacretia Nicks [facility] OFFICE Dec 25, 2017 THAT HER PULSE WAS 49-PT STATES SHE DOES HAVE OCC DIZZINESS.  HER LAST PULSE CHECKED IN DR WARDS OFFICE ON 01-04-18 WAS 80.  PT ASYMPTOMATIC OTHERWISE  . Complication of anesthesia    HARD TO WAKE UP  . GERD (gastroesophageal reflux disease)    NO MEDS  . Headache    MIGRAINES  . Hemorrhoids    PT HAVING HEMORRHOID BANDING IN OFFICE ON 01-07-18  . History of kidney stones    H/O  . Panic attack   . PONV (postoperative nausea and vomiting)     Patient Active Problem List   Diagnosis Date Noted  . Dysfunctional uterine bleeding 01/11/2018    Past Surgical History:  Procedure Laterality Date  . ADENOIDECTOMY    . CESAREAN SECTION     X2  . CHOLECYSTECTOMY    . HYSTEROSCOPY W/D&C N/A 01/11/2018   Procedure: DILATATION AND CURETTAGE /HYSTEROSCOPY;  Surgeon: Ward, Elenora Fender, MD;  Location: [facility] ORS;  Service: Gynecology;  Laterality: N/A;  . 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 RECTAL EXAM UNDER ANESTHESIA N/A 01/11/2018   Procedure: RECTAL EXAM UNDER ANESTHESIA;  Surgeon: Ward, Elenora Fender, MD;  Location: [facility] ORS;  Service: Gynecology;  Laterality: N/A;  . SPHINCTEROTOMY  01/11/2018   Procedure: SPHINCTEROTOMY;  Surgeon: Ward, Elenora Fender, MD;  Location: [facility] ORS;  Service: Gynecology;;  . TONSILLECTOMY      OB History   No  obstetric history on file.      Home Medications    Prior to Admission medications   Medication Sig Start Date End Date Taking? Authorizing Provider  etonogestrel (NEXPLANON) 68 MG IMPL implant 1 each by Subdermal route once.   Yes [provider]  gabapentin (NEURONTIN) 800 MG tablet Take by mouth.   Yes [provider]  traMADol (ULTRAM) 50 MG tablet Take by mouth.   Yes [provider]  benzonatate (TESSALON) 200 MG capsule Take one cap TID PRN cough 12/31/18   Ovid Curd P, PA-C  fluticasone [facility]) 50 MCG/ACT nasal spray Place 2 sprays into both nostrils daily. 12/31/18   Lutricia Feil, PA-C    Family History History reviewed. No pertinent family history.  Social History Social History   Tobacco Use  . Smoking status: Former Smoker    Packs/day: 0.25    Years: 8.00    Pack years: 2.00    Types: Cigarettes    Last attempt to quit: 08/05/2017    Years since quitting: 1.4  . Smokeless tobacco: Never Used  . Tobacco comment: PT STATES SHE STILL WILL SMOKE OCC  Substance Use Topics  . Alcohol use: Yes    Comment: OCC  . Drug use: No  Allergies   Morphine and related   Review of Systems Review of Systems  Constitutional: Positive for activity change. Negative for chills, fatigue and fever.  HENT: Positive for congestion, ear pain, postnasal drip, rhinorrhea and sore throat. Negative for ear discharge.   Respiratory: Positive for cough.   All other systems reviewed and are negative.    Physical Exam Triage Vital Signs ED Triage Vitals  Enc Vitals Group     BP 12/31/18 0843 124/70     Pulse Rate 12/31/18 0843 70     Resp 12/31/18 0843 16     Temp 12/31/18 0843 98.2 F (36.8 C)     Temp Source 12/31/18 0843 Oral     SpO2 12/31/18 0843 99 %     Weight 12/31/18 0840 185 lb (83.9 kg)     Height 12/31/18 0840 5\' 2"  (1.575 m)     Head Circumference --      Peak Flow --      Pain Score 12/31/18 0840 8     Pain Loc --       Pain Edu? --      Excl. in GC? --    No data found.  Updated Vital Signs BP 124/70 (BP Location: Right Arm)   Pulse 70   Temp 98.2 F (36.8 C) (Oral)   Resp 16   Ht 5\' 2"  (1.575 m)   Wt 185 lb (83.9 kg)   SpO2 99%   BMI 33.84 kg/m   Visual Acuity Right Eye Distance:   Left Eye Distance:   Bilateral Distance:    Right Eye Near:   Left Eye Near:    Bilateral Near:     Physical Exam Vitals signs and nursing note reviewed.  Constitutional:      General: She is not in acute distress.    Appearance: She is well-developed. She is not ill-appearing, toxic-appearing or diaphoretic.  HENT:     Head: Normocephalic.     Right Ear: Tympanic membrane and ear canal normal.     Left Ear: Tympanic membrane and ear canal normal.     Nose: No congestion or rhinorrhea.     Mouth/Throat:     Mouth: Mucous membranes are moist. No oral lesions.     Pharynx: No pharyngeal swelling, oropharyngeal exudate, posterior oropharyngeal erythema or uvula swelling.     Tonsils: No tonsillar exudate or tonsillar abscesses. Swelling: 1+ on the right. 1+ on the left.  Eyes:     Conjunctiva/sclera: Conjunctivae normal.  Neck:     Musculoskeletal: Normal range of motion and neck supple.  Pulmonary:     Effort: Pulmonary effort is normal.     Breath sounds: Normal breath sounds.  Lymphadenopathy:     Cervical: No cervical adenopathy.  Skin:    General: Skin is warm and dry.  Neurological:     General: No focal deficit present.     Mental Status: She is alert and oriented to person, place, and time.  Psychiatric:        Mood and Affect: Mood normal.        Behavior: Behavior normal.      UC Treatments / Results  Labs (all labs ordered are listed, but only abnormal results are displayed) Labs Reviewed  RAPID STREP SCREEN ([facility] ONLY)  CULTURE, GROUP A STREP [facility])    EKG None  Radiology No results found.  Procedures Procedures (including critical care time)  Medications  Ordered in UC Medications - No data  to display  Initial Impression / Assessment and Plan / UC Course  I have reviewed the triage vital signs and the nursing notes.  Pertinent labs & imaging results that were available during my care of the patient were reviewed by me and considered in my medical decision making (see chart for details).   Patient has an upper respiratory infection.  Told her this is viral and does not require antibiotics.  Cough suppressant in the form of Tessalon Perles.  Also recommended Flonase nasal spray for 2 to 3 weeks.  She is not improving follow-up with her primary care physician.   Final Clinical Impressions(s) / UC Diagnoses   Final diagnoses:  Upper respiratory tract infection, unspecified type     Discharge Instructions     Flonase daily the next 2 to 3 weeks.    ED Prescriptions    Medication Sig Dispense Auth. Provider   fluticasone (FLONASE) 50 MCG/ACT nasal spray Place 2 sprays into both nostrils daily. 16 g Ovid Curd P, PA-C   benzonatate (TESSALON) 200 MG capsule Take one cap TID PRN cough 30 capsule Lutricia Feil, PA-C     Controlled Substance Prescriptions [place] Controlled Substance Registry consulted? Not Applicable   Lutricia Feil, PA-C 12/31/18 1610

## 2019-01-03 LAB — CULTURE, GROUP A STREP (THRC)

## 2019-05-02 LAB — OB RESULTS CONSOLE HEPATITIS B SURFACE ANTIGEN: Hepatitis B Surface Ag: NEGATIVE

## 2019-05-05 LAB — OB RESULTS CONSOLE RUBELLA ANTIBODY, IGM: Rubella: IMMUNE

## 2019-05-05 LAB — OB RESULTS CONSOLE VARICELLA ZOSTER ANTIBODY, IGG: Varicella: IMMUNE

## 2019-09-05 ENCOUNTER — Observation Stay
Admission: EM | Admit: 2019-09-05 | Discharge: 2019-09-05 | Disposition: A | Discharge status: Home / Self Care | Payer: BLUE CROSS/BLUE SHIELD | Attending: Obstetrics & Gynecology | Admitting: Obstetrics & Gynecology

## 2019-09-05 ENCOUNTER — Other Ambulatory Visit: Payer: Self-pay

## 2019-09-05 DIAGNOSIS — Z79899 Other long term (current) drug therapy: Secondary | ICD-10-CM | POA: Insufficient documentation

## 2019-09-05 DIAGNOSIS — Z3A26 26 weeks gestation of pregnancy: Secondary | ICD-10-CM | POA: Insufficient documentation

## 2019-09-05 DIAGNOSIS — O99352 Diseases of the nervous system complicating pregnancy, second trimester: Secondary | ICD-10-CM | POA: Diagnosis not present

## 2019-09-05 DIAGNOSIS — G43909 Migraine, unspecified, not intractable, without status migrainosus: Secondary | ICD-10-CM | POA: Diagnosis not present

## 2019-09-05 DIAGNOSIS — Z885 Allergy status to narcotic agent status: Secondary | ICD-10-CM | POA: Insufficient documentation

## 2019-09-05 DIAGNOSIS — R519 Headache, unspecified: Secondary | ICD-10-CM | POA: Diagnosis present

## 2019-09-05 DIAGNOSIS — Z87891 Personal history of nicotine dependence: Secondary | ICD-10-CM | POA: Diagnosis not present

## 2019-09-05 DIAGNOSIS — O26892 Other specified pregnancy related conditions, second trimester: Secondary | ICD-10-CM | POA: Diagnosis present

## 2019-09-05 MED ORDER — SUMATRIPTAN SUCCINATE 50 MG PO TABS
100.0000 mg | ORAL_TABLET | Freq: Once | ORAL | Status: AC
  Administered 2019-09-05: 22:00:00 100 mg via ORAL
  Filled 2019-09-05: qty 2

## 2019-09-05 MED ORDER — SUMATRIPTAN SUCCINATE 6 MG/0.5ML ~~LOC~~ SOLN
6.0000 mg | Freq: Once | SUBCUTANEOUS | Status: AC
  Administered 2019-09-05: 6 mg via SUBCUTANEOUS
  Filled 2019-09-05: qty 0.5

## 2019-09-05 MED ORDER — LACTATED RINGERS IV BOLUS
1000.0000 mL | Freq: Once | INTRAVENOUS | Status: AC
  Administered 2019-09-05: 1000 mL via INTRAVENOUS

## 2019-09-05 MED ORDER — MEPERIDINE HCL 25 MG/ML IJ SOLN
25.0000 mg | Freq: Once | INTRAMUSCULAR | Status: AC
  Administered 2019-09-05: 21:00:00 25 mg via INTRAVENOUS

## 2019-09-05 MED ORDER — METOCLOPRAMIDE HCL 10 MG PO TABS
10.0000 mg | ORAL_TABLET | Freq: Once | ORAL | Status: AC
  Administered 2019-09-05: 10 mg via ORAL
  Filled 2019-09-05: qty 1

## 2019-09-05 MED ORDER — MEPERIDINE HCL 25 MG/ML IJ SOLN
INTRAMUSCULAR | Status: AC
  Administered 2019-09-05: 25 mg via INTRAVENOUS
  Filled 2019-09-05: qty 1

## 2019-09-05 MED ORDER — DIPHENHYDRAMINE HCL 25 MG PO CAPS
25.0000 mg | ORAL_CAPSULE | Freq: Once | ORAL | Status: AC
  Administered 2019-09-05: 18:00:00 25 mg via ORAL

## 2019-09-05 MED ORDER — DIPHENHYDRAMINE HCL 25 MG PO CAPS
ORAL_CAPSULE | ORAL | Status: AC
  Administered 2019-09-05: 25 mg via ORAL
  Filled 2019-09-05: qty 1

## 2019-09-05 NOTE — Discharge Summary (Signed)
Angela Mckee is a 28 y.o. female. She is at [redacted]w[redacted]d gestation. Patient's last menstrual period was 03/03/2019. Estimated Date of Delivery: 12/08/19  Prenatal care site: [facility]  Chief complaint: headache  Location: frontal Onset/timing: 3 days ago Duration: 3 days Quality:  Tight aching pounding Severity: severe Aggravating or alleviating conditions: Associated signs/symptoms: +nausea, +photosensitivity, no CTX, no VB.no LOF,  Active fetal movement.  Context: pt with hx of migraines, has had one for the last 3 days with no relief.   Maternal Medical History:   Past Medical History:  Diagnosis Date  . Bradycardia    PT STATES THAT AT DR Viona Gilmore [facility] OFFICE Dec 25, 2017 THAT HER PULSE WAS 49-PT STATES SHE DOES HAVE OCC DIZZINESS.  HER LAST PULSE CHECKED IN DR WARDS OFFICE ON 01-04-18 WAS 80.  PT ASYMPTOMATIC OTHERWISE  . Complication of anesthesia    HARD TO WAKE UP  . GERD (gastroesophageal reflux disease)    NO MEDS  . Headache    MIGRAINES  . Hemorrhoids    PT HAVING HEMORRHOID BANDING IN OFFICE ON 01-07-18  . History of kidney stones    H/O  . Panic attack   . PONV (postoperative nausea and vomiting)     Past Surgical History:  Procedure Laterality Date  . ADENOIDECTOMY    . CESAREAN SECTION     X2  . CHOLECYSTECTOMY    . HYSTEROSCOPY W/D&C N/A 01/11/2018   Procedure: DILATATION AND CURETTAGE /HYSTEROSCOPY;  Surgeon: Ward, Honor Loh, MD;  Location: [facility] ORS;  Service: Gynecology;  Laterality: N/A;  . 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 RECTAL EXAM UNDER ANESTHESIA N/A 01/11/2018   Procedure: RECTAL EXAM UNDER ANESTHESIA;  Surgeon: Ward, Honor Loh, MD;  Location: [facility] ORS;  Service: Gynecology;  Laterality: N/A;  . SPHINCTEROTOMY  01/11/2018   Procedure: SPHINCTEROTOMY;  Surgeon: Ward, Honor Loh, MD;  Location: [facility] ORS;  Service: Gynecology;;  . TONSILLECTOMY       Allergies  Allergen Reactions  . Morphine And Related Hives, Shortness Of Breath and Swelling    Prior to Admission medications   Medication Sig Start Date End Date Taking? Authorizing Provider  acetaminophen (TYLENOL) 500 MG tablet Take 500 mg by mouth every 6 (six) hours as needed.   Yes [provider]  busPIRone (BUSPAR) 5 MG tablet Take 5 mg by mouth 3 (three) times daily.   Yes [provider]  escitalopram (LEXAPRO) 10 MG tablet Take 10 mg by mouth daily.   Yes [provider]  Prenatal Vit-Fe Fumarate-FA (MULTIVITAMIN-PRENATAL) 27-0.8 MG TABS tablet Take 1 tablet by mouth daily at 12 noon.   Yes [provider]  vitamin B-6 (PYRIDOXINE) 25 MG tablet Take 25 mg by mouth daily.   Yes [provider]  benzonatate (TESSALON) 200 MG capsule Take one cap TID PRN cough Patient not taking: Reported on 09/05/2019 12/31/18   Lorin Picket, PA-C  etonogestrel (NEXPLANON) 68 MG IMPL implant 1 each by Subdermal route once.    [provider]  fluticasone (FLONASE) 50 MCG/ACT nasal spray Place 2 sprays into both nostrils daily. Patient not taking: Reported on 09/05/2019 12/31/18   Crecencio Mc P, PA-C  gabapentin (NEURONTIN) 800 MG tablet Take by mouth.    [provider]  traMADol (ULTRAM) 50 MG tablet Take by mouth.    [provider]     Social History: She  reports that she quit smoking about 2 years ago. Her smoking use included cigarettes. She has a 2.00 pack-year smoking history. She has never used smokeless tobacco. She reports current alcohol use. She reports that she does not use drugs.  Family History: obesity, alzheimer's, anxiety, depression, breast cancer, asthma  Review of Systems: A full review of systems was performed and negative except as noted in the HPI.     O:  BP 114/66 (BP Location: Left Arm)   Pulse (!) 56   Temp 98.9 F (37.2 C) (Oral)   Resp 16   Ht 5\' 2"  (1.575 m)   Wt 90.7 kg    LMP 03/03/2019   SpO2 99%   BMI 36.58 kg/m  No results found for this or any previous visit (from the past 48 hour(s)).   Constitutional: NAD, AAOx3  HE/ENT: extraocular movements grossly intact, moist mucous membranes CV: RRR PULM: nl respiratory effort, CTABL     Abd: gravid, non-tender, non-distended, soft     Ext: Non-tender, Nonedmeatous   Psych: mood appropriate, speech normal Pelvic deferred  Fetal heart tracing: present 145bpm   Assessment: 28 y.o. 7964w4d here for antenatal surveillance during pregnancy.  Principle diagnosis: migraine headache  Plan:  Labor: not present.   Fetal Wellbeing: Reassuring Cat 1 tracing.  HA: s/p tylenol @ home, given reglan, benadryl, fluid bolus, imatrex sq and PO, and demerol IV.  HA is better and patient asks for discharge.   D/c home stable, precautions reviewed, follow-up as scheduled.   ----- Ranae Plumberhelsea Ward, MD Attending Obstetrician and Gynecologist [facility], Department of OB/GYN [facility]

## 2019-09-05 NOTE — Discharge Instructions (Signed)
Call provider or return to birthplace with: ? ?1. Regular contractions ?2. Leaking of fluid from your vagina ?3. Vaginal bleeding: Bright red or heavy like a period ?4. Decreased Fetal movement  ?

## 2019-09-05 NOTE — Progress Notes (Signed)
Pt removed from fetal monitoring per verbal order from MD. Will continue to monitor patient's pain.

## 2019-09-05 NOTE — OB Triage Note (Addendum)
Pt presents c/o migraine for the last 3 days unrelieved by extra strength tylenol. Pt also states she has stomach and back cramps. Pt states cramping comes and goes. Pt denies any bleeding or LOF. Pt denies recent intercourse. Pt states she has been drinking and eating plenty. Pt lungs clear. Denies blurry vision or epigastric pain. No clonus. +2 reflexes Reports positive fetal movement. Vitals WNL. Will continue to monitor.

## 2019-09-09 ENCOUNTER — Emergency Department
Admission: EM | Admit: 2019-09-09 | Discharge: 2019-09-09 | Disposition: A | Discharge status: Home / Self Care | Payer: Medicaid Other | Source: Home / Self Care | Attending: Emergency Medicine | Admitting: Emergency Medicine

## 2019-09-09 ENCOUNTER — Emergency Department: Payer: Medicaid Other

## 2019-09-09 ENCOUNTER — Encounter: Payer: Self-pay | Admitting: *Deleted

## 2019-09-09 ENCOUNTER — Other Ambulatory Visit: Payer: Self-pay

## 2019-09-09 ENCOUNTER — Observation Stay
Admission: EM | Admit: 2019-09-09 | Discharge: 2019-09-09 | Disposition: A | Discharge status: Home / Self Care | Payer: Medicaid Other | Attending: Certified Nurse Midwife | Admitting: Certified Nurse Midwife

## 2019-09-09 DIAGNOSIS — Z79899 Other long term (current) drug therapy: Secondary | ICD-10-CM | POA: Insufficient documentation

## 2019-09-09 DIAGNOSIS — Z885 Allergy status to narcotic agent status: Secondary | ICD-10-CM | POA: Diagnosis not present

## 2019-09-09 DIAGNOSIS — O368120 Decreased fetal movements, second trimester, not applicable or unspecified: Principal | ICD-10-CM | POA: Insufficient documentation

## 2019-09-09 DIAGNOSIS — H9202 Otalgia, left ear: Secondary | ICD-10-CM | POA: Diagnosis not present

## 2019-09-09 DIAGNOSIS — S0083XA Contusion of other part of head, initial encounter: Secondary | ICD-10-CM | POA: Insufficient documentation

## 2019-09-09 DIAGNOSIS — O26892 Other specified pregnancy related conditions, second trimester: Secondary | ICD-10-CM | POA: Insufficient documentation

## 2019-09-09 DIAGNOSIS — Z87891 Personal history of nicotine dependence: Secondary | ICD-10-CM | POA: Insufficient documentation

## 2019-09-09 DIAGNOSIS — Z3A27 27 weeks gestation of pregnancy: Secondary | ICD-10-CM | POA: Insufficient documentation

## 2019-09-09 DIAGNOSIS — O368190 Decreased fetal movements, unspecified trimester, not applicable or unspecified: Secondary | ICD-10-CM | POA: Diagnosis present

## 2019-09-09 DIAGNOSIS — S0990XA Unspecified injury of head, initial encounter: Secondary | ICD-10-CM | POA: Insufficient documentation

## 2019-09-09 DIAGNOSIS — Y939 Activity, unspecified: Secondary | ICD-10-CM | POA: Insufficient documentation

## 2019-09-09 DIAGNOSIS — Y929 Unspecified place or not applicable: Secondary | ICD-10-CM | POA: Insufficient documentation

## 2019-09-09 DIAGNOSIS — Y999 Unspecified external cause status: Secondary | ICD-10-CM | POA: Insufficient documentation

## 2019-09-09 DIAGNOSIS — O26893 Other specified pregnancy related conditions, third trimester: Secondary | ICD-10-CM | POA: Insufficient documentation

## 2019-09-09 MED ORDER — ACETAMINOPHEN 325 MG PO TABS
650.0000 mg | ORAL_TABLET | Freq: Once | ORAL | Status: AC
  Administered 2019-09-09: 650 mg via ORAL
  Filled 2019-09-09: qty 2

## 2019-09-09 MED ORDER — ACETAMINOPHEN 500 MG PO TABS: 1000.0000 mg | ORAL_TABLET | Freq: Four times a day (QID) | ORAL | 0 refills | Status: DC | PRN

## 2019-09-09 NOTE — ED Notes (Signed)
Pt verbalized understanding of discharge instructions. NAD at this time. 
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
## 2019-09-09 NOTE — Discharge Instructions (Signed)
Encouraged to be seen in the ED to have face and ear checked out.
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
## 2019-09-09 NOTE — ED Notes (Signed)
Signature pad not working. Hard copy printed and signed by patient.  

## 2019-11-16 LAB — OB RESULTS CONSOLE HIV ANTIBODY (ROUTINE TESTING): HIV: NONREACTIVE

## 2019-11-16 LAB — OB RESULTS CONSOLE RPR: RPR: NONREACTIVE

## 2019-11-16 LAB — OB RESULTS CONSOLE GBS: GBS: NEGATIVE

## 2019-11-28 ENCOUNTER — Encounter
Admission: RE | Admit: 2019-11-28 | Discharge: 2019-11-28 | Disposition: A | Discharge status: Home / Self Care | Payer: Medicaid Other | Source: Ambulatory Visit | Attending: Obstetrics & Gynecology | Admitting: Obstetrics & Gynecology

## 2019-11-28 HISTORY — DX: Unspecified asthma, uncomplicated: J45.909

## 2019-11-28 NOTE — Patient Instructions (Signed)
Your procedure is scheduled on: 12-02-19 FRIDAY Report to [place] @ 5:15 AM- UPON ARRIVAL CALL [phone]  (LABOR AND DELIVERY) AND SOMEONE WILL COME AND ESCORT YOU TO LABOR ON DELIVERY ON 3RD FLOOR   Remember: Instructions that are not followed completely may result in serious medical risk, up to and including death, or upon the discretion of your surgeon and anesthesiologist your surgery may need to be rescheduled.    _x___ 1. Do not eat food after midnight the night before your procedure. NO GUM OR CANDY AFTER MIDNIGHT. You may drink clear liquids up to 2 hours before you are scheduled to arrive at the hospital for your procedure.  Do not drink clear liquids within 2 hours of your scheduled arrival to the hospital.  Clear liquids include  --Water or Apple juice without pulp  --Gatorade  --Black Coffee or Clear Tea (No milk, no creamers, do not add anything to the coffee or Tea   ____Ensure clear carbohydrate drink on the way to the hospital for bariatric patients  ____Ensure clear carbohydrate drink 3 hours before surgery.     __x__ 2. No Alcohol for 24 hours before or after surgery.   __x__3. No Smoking or e-cigarettes for 24 prior to surgery.  Do not use any chewable tobacco products for at least 6 hour prior to surgery   ____  4. Bring all medications with you on the day of surgery if instructed.    __x__ 5. Notify your doctor if there is any change in your medical condition     (cold, fever, infections).    x___6. On the morning of surgery brush your teeth with toothpaste and water.  You may rinse your mouth with mouth wash if you wish.  Do not swallow any toothpaste or mouthwash.   Do not wear jewelry, make-up, hairpins, clips or nail polish.  Do not wear lotions, powders, or perfumes..  Do not shave 48 hours prior to surgery. Men may shave face and neck.  Do not bring valuables to the hospital.    [facility] is not responsible for any belongings or valuables.             Contacts, dentures or bridgework may not be worn into surgery.  Leave your suitcase in the car. After surgery it may be brought to your room.  For patients admitted to the hospital, discharge time is determined by your treatment team.  _  Patients discharged the day of surgery will not be allowed to drive home.  You will need someone to drive you home and stay with you the night of your procedure.    Please read over the following fact sheets that you were given:   [facility] Preparing for Surgery    _x___ TAKE THE FOLLOWING MEDICATION THE MORNING OF SURGERY WITH A SMALL SIP OF WATER. These include:  1. LEXAPRO   2.BUSPAR  3.  4.  5.  6.  ____Fleets enema or Magnesium Citrate as directed.   _x___ Use CHG Soap or sage wipes as directed on instruction sheet-AVOID NIPPLE AND PRIVATE AREA  ____ Use inhalers on the day of surgery and bring to hospital day of surgery  ____ Stop Metformin and Janumet 2 days prior to surgery.    ____ Take 1/2 of usual insulin dose the night before surgery and none on the morning surgery.   ____ Follow recommendations from Cardiologist, Pulmonologist or PCP regarding stopping Aspirin, Coumadin, Plavix ,Eliquis, Effient, or Pradaxa, and Pletal.  X____Stop Anti-inflammatories such as Advil, Aleve, Ibuprofen, Motrin, Naproxen, Naprosyn, Goodies powders or aspirin products NOW-OK to take Tylenol    ____ Stop supplements until after surgery.    ____ Bring C-Pap to the hospital.

## 2019-11-29 ENCOUNTER — Observation Stay
Admission: EM | Admit: 2019-11-29 | Discharge: 2019-11-29 | Disposition: A | Discharge status: Home / Self Care | Payer: Medicaid Other | Attending: Obstetrics and Gynecology | Admitting: Obstetrics and Gynecology

## 2019-11-29 ENCOUNTER — Other Ambulatory Visit
Admission: RE | Admit: 2019-11-29 | Discharge: 2019-11-29 | Disposition: A | Discharge status: Home / Self Care | Payer: Medicaid Other | Source: Ambulatory Visit | Attending: Obstetrics & Gynecology | Admitting: Obstetrics & Gynecology

## 2019-11-29 ENCOUNTER — Other Ambulatory Visit: Payer: Self-pay

## 2019-11-29 DIAGNOSIS — O99513 Diseases of the respiratory system complicating pregnancy, third trimester: Secondary | ICD-10-CM | POA: Diagnosis not present

## 2019-11-29 DIAGNOSIS — Z86718 Personal history of other venous thrombosis and embolism: Secondary | ICD-10-CM | POA: Insufficient documentation

## 2019-11-29 DIAGNOSIS — O99343 Other mental disorders complicating pregnancy, third trimester: Secondary | ICD-10-CM | POA: Insufficient documentation

## 2019-11-29 DIAGNOSIS — O368330 Maternal care for abnormalities of the fetal heart rate or rhythm, third trimester, not applicable or unspecified: Principal | ICD-10-CM | POA: Insufficient documentation

## 2019-11-29 DIAGNOSIS — F410 Panic disorder [episodic paroxysmal anxiety] without agoraphobia: Secondary | ICD-10-CM | POA: Insufficient documentation

## 2019-11-29 DIAGNOSIS — Z20828 Contact with and (suspected) exposure to other viral communicable diseases: Secondary | ICD-10-CM | POA: Insufficient documentation

## 2019-11-29 DIAGNOSIS — Z87891 Personal history of nicotine dependence: Secondary | ICD-10-CM | POA: Insufficient documentation

## 2019-11-29 DIAGNOSIS — Z3A38 38 weeks gestation of pregnancy: Secondary | ICD-10-CM | POA: Insufficient documentation

## 2019-11-29 DIAGNOSIS — O368390 Maternal care for abnormalities of the fetal heart rate or rhythm, unspecified trimester, not applicable or unspecified: Secondary | ICD-10-CM | POA: Diagnosis present

## 2019-11-29 DIAGNOSIS — Z79899 Other long term (current) drug therapy: Secondary | ICD-10-CM | POA: Insufficient documentation

## 2019-11-29 DIAGNOSIS — J45909 Unspecified asthma, uncomplicated: Secondary | ICD-10-CM | POA: Insufficient documentation

## 2019-11-29 DIAGNOSIS — Z3490 Encounter for supervision of normal pregnancy, unspecified, unspecified trimester: Secondary | ICD-10-CM

## 2019-11-29 LAB — TYPE AND SCREEN
ABO/RH(D): AB POS
Antibody Screen: NEGATIVE
Extend sample reason: UNDETERMINED

## 2019-11-29 LAB — CBC
HCT: 36.8 % (ref 36.0–46.0)
Hemoglobin: 12.7 g/dL (ref 12.0–15.0)
MCH: 31.1 pg (ref 26.0–34.0)
MCHC: 34.5 g/dL (ref 30.0–36.0)
MCV: 90.2 fL (ref 80.0–100.0)
Platelets: 184 10*3/uL (ref 150–400)
RBC: 4.08 MIL/uL (ref 3.87–5.11)
RDW: 12.3 % (ref 11.5–15.5)
WBC: 9 10*3/uL (ref 4.0–10.5)
nRBC: 0 % (ref 0.0–0.2)

## 2019-11-29 NOTE — Discharge Instructions (Signed)

## 2019-11-29 NOTE — OB Triage Note (Signed)
Pt was sent over from MD office for further monitoring. MD office reports NST done in the office with variables present.

## 2019-11-29 NOTE — Progress Notes (Addendum)
Patient ID: Angela Mckee, female   DOB: 1991-05-03, 28 y.o.   MRN: [id number]  Angela Mckee is a 28 y.o. female. She is at [redacted]w[redacted]d gestation. Patient's last menstrual period was 03/03/2019. Estimated Date of Delivery: 12/08/19  Prenatal care site: [facility]  Chief complaint: variable decels in clinic for  IUGR monitoring  38+5 weeks  : Context:  S: Resting comfortably. no CTX, no VB.no LOF,  Active fetal movement.   Maternal Medical History:   Past Medical History:  Diagnosis Date  . Asthma    WELL CONTROLLED  . Bradycardia    PT STATES THAT AT DR Lacretia Nicks [facility] OFFICE Dec 25, 2017 THAT HER PULSE WAS 49-PT STATES SHE DOES HAVE OCC DIZZINESS.  HER LAST PULSE CHECKED IN DR WARDS OFFICE ON 01-04-18 WAS 80.  PT ASYMPTOMATIC OTHERWISE  . Complication of anesthesia    HARD TO WAKE UP  . DVT (deep venous thrombosis) ([facility]) 2018   AFTER WEIGHT LOSS SURGERY  . GERD (gastroesophageal reflux disease)    NO MEDS  . Headache    MIGRAINES  . Hemorrhoids    PT HAVING HEMORRHOID BANDING IN OFFICE ON 01-07-18  . History of kidney stones    H/O  . Panic attack   . PONV (postoperative nausea and vomiting)     Past Surgical History:  Procedure Laterality Date  . ADENOIDECTOMY    . CESAREAN SECTION     X2  . CHOLECYSTECTOMY    . HYSTEROSCOPY W/D&C N/A 01/11/2018   Procedure: DILATATION AND CURETTAGE /HYSTEROSCOPY;  Surgeon: Ward, Elenora Fender, MD;  Location: [facility] ORS;  Service: Gynecology;  Laterality: N/A;  . 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 RECTAL EXAM UNDER ANESTHESIA N/A 01/11/2018   Procedure: RECTAL EXAM UNDER ANESTHESIA;  Surgeon: Ward, Elenora Fender, MD;  Location: [facility] ORS;  Service: Gynecology;  Laterality: N/A;  . SPHINCTEROTOMY  01/11/2018   Procedure: SPHINCTEROTOMY;  Surgeon: Ward, Elenora Fender, MD;  Location: [facility] ORS;  Service: Gynecology;;  . TONSILLECTOMY      Allergies  Allergen  Reactions  . Morphine And Related Hives, Shortness Of Breath and Swelling    Prior to Admission medications   Medication Sig Start Date End Date Taking? Authorizing Provider  acetaminophen (TYLENOL) 500 MG tablet Take 2 tablets (1,000 mg total) by mouth every 6 (six) hours as needed. Patient not taking: Reported on 11/16/2019 09/09/19   Genia Del, CNM  acyclovir (ZOVIRAX) 400 MG tablet Take 400 mg by mouth 3 (three) times daily. 11/03/19   [provider]  busPIRone (BUSPAR) 5 MG tablet Take 5 mg by mouth every morning.     [provider]  escitalopram (LEXAPRO) 10 MG tablet Take 10 mg by mouth every morning.     [provider]  fluticasone (FLONASE) 50 MCG/ACT nasal spray Place 2 sprays into both nostrils daily. Patient not taking: Reported on 09/05/2019 12/31/18   Lutricia Feil, PA-C  Prenatal Vit-Fe Fumarate-FA (MULTIVITAMIN-PRENATAL) 27-0.8 MG TABS tablet Take 1 tablet by mouth daily at 12 noon.    [provider]  vitamin B-6 (PYRIDOXINE) 25 MG tablet Take 25 mg by mouth daily.    [provider]     Social History: She  reports that she quit smoking about 2 years ago. Her smoking use included cigarettes. She has a 2.00 pack-year smoking history. She has never used smokeless tobacco. She reports  previous alcohol use. She reports that she does not use drugs.  Family History: family history is not on file.  no history of gyn cancers  Review of Systems: A full review of systems was performed and negative except as noted in the HPI.   Review of Systems: A full review of systems was performed and negative except as noted in the HPI.   Eyes: no vision change  Ears: left ear pain  Oropharynx: no sore throat  Pulmonary . No shortness of breath , no hemoptysis Cardiovascular: no chest pain , no irregular heart beat  Gastrointestinal:no blood in stool . No diarrhea, no constipation Uro gynecologic: no dysuria , no pelvic  pain Neurologic : no seizure , no migraines    Musculoskeletal: no muscular weakness   O:  LMP 03/03/2019  Results for orders placed or performed during the hospital encounter of 11/29/19 (from the past 48 hour(s))  CBC   Collection Time: 11/29/19 10:26 AM  Result Value Ref Range   WBC 9.0 4.0 - 10.5 K/uL   RBC 4.08 3.87 - 5.11 MIL/uL   Hemoglobin 12.7 12.0 - 15.0 g/dL   HCT 36.8 36.0 - 46.0 %   MCV 90.2 80.0 - 100.0 fL   MCH 31.1 26.0 - 34.0 pg   MCHC 34.5 30.0 - 36.0 g/dL   RDW 12.3 11.5 - 15.5 %   Platelets 184 150 - 400 K/uL   nRBC 0.0 0.0 - 0.2 %  Type and screen [place]   Collection Time: 11/29/19 10:26 AM  Result Value Ref Range   ABO/RH(D) AB POS    Antibody Screen NEG    Sample Expiration 12/02/2019,2359    Extend sample reason      PREGNANT WITHIN 3 MONTHS, UNABLE TO EXTEND Performed at [facility], [address] [place], [place] [address]      Constitutional: NAD, AAOx3  HE/ENT: extraocular movements grossly intact, moist mucous membranes CV: RRR PULM: nl respiratory effort, CTABL     Abd: gravid, non-tender, non-distended, soft      Ext: Non-tender, Nonedmeatous   Psych: mood appropriate, speech normal Pelvic deferred  NST: REACtive Baseline: Variability: moderate Accelerations present x >2 Decelerations absent Time 70mins    Assessment: 28 y.o. [redacted]w[redacted]d here for antenatal surveillance during pregnancy.  Principle diagnosis:   Plan:  Labor: not present.   Fetal Wellbeing: Reassuring Cat 1 tracing.  Reactive NST   D/c home stable, precautions reviewed, follow-up as scheduled.   ----- Huel Cote MD Attending Obstetrician and Gynecologist [facility], Department of [facility]

## 2019-11-30 LAB — SARS CORONAVIRUS 2 (TAT 6-24 HRS): SARS Coronavirus 2: NEGATIVE

## 2019-11-30 LAB — RPR: RPR Ser Ql: NONREACTIVE

## 2019-11-30 NOTE — Discharge Summary (Signed)
Angela Mckee is a 28 y.o. female. She is at [redacted]w[redacted]d gestation. Patient's last menstrual period was 03/03/2019.  Estimated Date of Delivery: 12/08/19  Prenatal care site: [facility]  Chief complaint: variable decels in clinic for IUGR monitoring  38+5 weeks  :  Context:  S: Resting comfortably. no CTX, no VB.no LOF, Active fetal movement.   Maternal Medical History:      Past Medical History:  Diagnosis Date  . Asthma    WELL CONTROLLED  . Bradycardia    PT STATES THAT AT DR Viona Gilmore [facility] OFFICE Dec 25, 2017 THAT HER PULSE WAS 49-PT STATES SHE DOES HAVE OCC DIZZINESS. HER LAST PULSE CHECKED IN DR WARDS OFFICE ON 01-04-18 WAS 80. PT ASYMPTOMATIC OTHERWISE  . Complication of anesthesia    HARD TO WAKE UP  . DVT (deep venous thrombosis) ([place]) 2018   AFTER WEIGHT LOSS SURGERY  . GERD (gastroesophageal reflux disease)    NO MEDS  . Headache    MIGRAINES  . Hemorrhoids    PT HAVING HEMORRHOID BANDING IN OFFICE ON 01-07-18  . History of kidney stones    H/O  . Panic attack   . PONV (postoperative nausea and vomiting)        Past Surgical History:  Procedure Laterality Date  . ADENOIDECTOMY    . CESAREAN SECTION     X2  . CHOLECYSTECTOMY    . HYSTEROSCOPY W/D&C N/A 01/11/2018   Procedure: DILATATION AND CURETTAGE /HYSTEROSCOPY; Surgeon: Ward, Honor Loh, MD; Location: [facility] ORS; Service: Gynecology; Laterality: N/A;  . 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 RECTAL EXAM UNDER ANESTHESIA N/A 01/11/2018   Procedure: RECTAL EXAM UNDER ANESTHESIA; Surgeon: Ward, Honor Loh, MD; Location: [facility] ORS; Service: Gynecology; Laterality: N/A;  . SPHINCTEROTOMY  01/11/2018   Procedure: SPHINCTEROTOMY; Surgeon: Ward, Honor Loh, MD; Location: [facility] ORS; Service: Gynecology;;  . TONSILLECTOMY        Allergies  Allergen Reactions  . Morphine And Related Hives, Shortness Of Breath and Swelling         Prior to  Admission medications   Medication Sig Start Date End Date Taking? Authorizing Provider  acetaminophen (TYLENOL) 500 MG tablet Take 2 tablets (1,000 mg total) by mouth every 6 (six) hours as needed.  Patient not taking: Reported on 11/16/2019 09/09/19   Lisette Grinder, CNM  acyclovir (ZOVIRAX) 400 MG tablet Take 400 mg by mouth 3 (three) times daily. 11/03/19   [provider]  busPIRone (BUSPAR) 5 MG tablet Take 5 mg by mouth every morning.     [provider]  escitalopram (LEXAPRO) 10 MG tablet Take 10 mg by mouth every morning.     [provider]  fluticasone (FLONASE) 50 MCG/ACT nasal spray Place 2 sprays into both nostrils daily.  Patient not taking: Reported on 09/05/2019 12/31/18   Lorin Picket, PA-C  Prenatal Vit-Fe Fumarate-FA (MULTIVITAMIN-PRENATAL) 27-0.8 MG TABS tablet Take 1 tablet by mouth daily at 12 noon.    [provider]  vitamin B-6 (PYRIDOXINE) 25 MG tablet Take 25 mg by mouth daily.    [provider]  Social History: She reports that she quit smoking about 2 years ago. Her smoking use included cigarettes. She has a 2.00 pack-year smoking history. She has never used smokeless tobacco. She reports previous alcohol use. She reports that she does not use drugs.  Family History: family history is not on  file. no history of gyn cancers  Review of Systems: A full review of systems was performed and negative except as noted in the HPI.  Review of Systems: A full review of systems was performed and negative except as noted in the HPI.  Eyes: no vision change  Ears: left ear pain  Oropharynx: no sore throat  Pulmonary . No shortness of breath , no hemoptysis  Cardiovascular: no chest pain , no irregular heart beat  Gastrointestinal:no blood in stool . No diarrhea, no constipation  Uro gynecologic: no dysuria , no pelvic pain  Neurologic : no seizure , no migraines  Musculoskeletal: no muscular weakness  O:  Objective                                                                                                                                Constitutional: NAD, AAOx3  HE/ENT: extraocular movements grossly intact, moist mucous membranes  CV: RRR  PULM: nl respiratory effort, CTABL  Abd: gravid, non-tender, non-distended, soft  Ext: Non-tender, Nonedmeatous  Psych: mood appropriate, speech normal  Pelvic deferred  NST: REACtive  Baseline:  Variability: moderate  Accelerations present x >2  Decelerations absent  Time  Assessment: 28 y.o. [redacted]w[redacted]d here for antenatal surveillance during pregnancy.  Principle diagnosis:  Plan:  Labor: not present.  Fetal Wellbeing: Reassuring Cat 1 tracing.  Reactive NST  D/c home stable, precautions reviewed, follow-up as scheduled.  -----  Beverly Gust MD  Attending Obstetrician and Gynecologist  [facility], Department of OB/GYN  [facility]    Electronically signed by Schermerhorn, Ihor Austin, MD at 11/30/2019 4:17 AM

## 2019-12-01 ENCOUNTER — Encounter: Payer: Self-pay | Admitting: Obstetrics and Gynecology

## 2019-12-01 ENCOUNTER — Observation Stay
Admission: EM | Admit: 2019-12-01 | Discharge: 2019-12-01 | Disposition: A | Discharge status: Home / Self Care | Payer: Medicaid Other | Source: Home / Self Care | Admitting: Obstetrics and Gynecology

## 2019-12-01 ENCOUNTER — Other Ambulatory Visit: Payer: Self-pay

## 2019-12-01 DIAGNOSIS — O98313 Other infections with a predominantly sexual mode of transmission complicating pregnancy, third trimester: Secondary | ICD-10-CM | POA: Insufficient documentation

## 2019-12-01 DIAGNOSIS — O99213 Obesity complicating pregnancy, third trimester: Secondary | ICD-10-CM | POA: Insufficient documentation

## 2019-12-01 DIAGNOSIS — F329 Major depressive disorder, single episode, unspecified: Secondary | ICD-10-CM | POA: Insufficient documentation

## 2019-12-01 DIAGNOSIS — O3422 Maternal care for cesarean scar defect (isthmocele): Secondary | ICD-10-CM | POA: Insufficient documentation

## 2019-12-01 DIAGNOSIS — Z87891 Personal history of nicotine dependence: Secondary | ICD-10-CM | POA: Insufficient documentation

## 2019-12-01 DIAGNOSIS — O368130 Decreased fetal movements, third trimester, not applicable or unspecified: Secondary | ICD-10-CM | POA: Insufficient documentation

## 2019-12-01 DIAGNOSIS — O99343 Other mental disorders complicating pregnancy, third trimester: Secondary | ICD-10-CM | POA: Insufficient documentation

## 2019-12-01 DIAGNOSIS — Z86718 Personal history of other venous thrombosis and embolism: Secondary | ICD-10-CM | POA: Insufficient documentation

## 2019-12-01 DIAGNOSIS — Z3A39 39 weeks gestation of pregnancy: Secondary | ICD-10-CM | POA: Insufficient documentation

## 2019-12-01 DIAGNOSIS — O365930 Maternal care for other known or suspected poor fetal growth, third trimester, not applicable or unspecified: Secondary | ICD-10-CM | POA: Insufficient documentation

## 2019-12-01 DIAGNOSIS — F410 Panic disorder [episodic paroxysmal anxiety] without agoraphobia: Secondary | ICD-10-CM | POA: Insufficient documentation

## 2019-12-01 DIAGNOSIS — A6000 Herpesviral infection of urogenital system, unspecified: Secondary | ICD-10-CM | POA: Insufficient documentation

## 2019-12-01 DIAGNOSIS — O10913 Unspecified pre-existing hypertension complicating pregnancy, third trimester: Secondary | ICD-10-CM | POA: Insufficient documentation

## 2019-12-01 DIAGNOSIS — Z9884 Bariatric surgery status: Secondary | ICD-10-CM | POA: Insufficient documentation

## 2019-12-01 DIAGNOSIS — Z7982 Long term (current) use of aspirin: Secondary | ICD-10-CM | POA: Insufficient documentation

## 2019-12-01 DIAGNOSIS — Z79899 Other long term (current) drug therapy: Secondary | ICD-10-CM | POA: Insufficient documentation

## 2019-12-01 NOTE — Discharge Summary (Signed)
Angela Mckee is a 28 y.o. female. She is at [redacted]w[redacted]d gestation. Patient's last menstrual period was 03/03/2019. Estimated Date of Delivery: 12/08/19  Prenatal care site: [facility]   Current pregnancy complicated by:  IUGR by growth [place] on 11/29/19, EFW < 10%ile, 2726grams 1. Hx genital HSV  On acyclovir 400 TID 2. CHTN  no current meds  Baby ASA daily, baseline CMP and P/C WNL 3. Obesity, BMI 36 at transfer  Hx gastroplasty, duodenal switch.  Initial A1C 4.4, early GTT 85 4. Prior CS x 2  LTCS scheduled Dec 11 CW  2008 [facility]): per notes from [facility]- failed CST at 41wks   2012 [facility]- elective repeat, 1 layer closure, no extensions: [facility] Op report received   Op reports requested 08/25/19 5. Anxiety/depression  Previously on Lexapro but had stopped in early pregnancy   EPDS at initial visit: 20; Needs repeat next visit.   Rx Lexapro, Buspar- working well.   encouraged counseling- given list of providers  Chief complaint: decreased fetal movement  Location: abdomen- decreased movement since yesterday Onset/timing: since 12/8 Duration: constant Quality: feeling some slight movements but decreased from the baby's normal movements.  Severity:n/a Aggravating or alleviating conditions: tried laying down and drinking something with no change.  Associated signs/symptoms: none Context: scheduled for repeat CS tomorrow morning.   S: Resting comfortably. no CTX, no VB.no LOF,  Active fetal movement noted now on admission.  Denies: HA, visual changes, SOB, or RUQ/epigastric pain  Maternal Medical History:   Past Medical History:  Diagnosis Date  . Asthma    WELL CONTROLLED  . Bradycardia    PT STATES THAT AT DR Lacretia Nicks [facility] OFFICE Dec 25, 2017 THAT HER PULSE WAS 49-PT STATES SHE DOES HAVE OCC DIZZINESS.  HER LAST PULSE CHECKED IN DR WARDS OFFICE ON 01-04-18 WAS 80.  PT ASYMPTOMATIC OTHERWISE  . Complication of anesthesia    HARD TO WAKE UP  . DVT (deep venous  thrombosis) ([facility]) 2018   AFTER WEIGHT LOSS SURGERY  . GERD (gastroesophageal reflux disease)    NO MEDS  . Headache    MIGRAINES  . Hemorrhoids    PT HAVING HEMORRHOID BANDING IN OFFICE ON 01-07-18  . History of kidney stones    H/O  . Panic attack   . PONV (postoperative nausea and vomiting)     Past Surgical History:  Procedure Laterality Date  . ADENOIDECTOMY    . CESAREAN SECTION     X2  . CHOLECYSTECTOMY    . HYSTEROSCOPY W/D&C N/A 01/11/2018   Procedure: DILATATION AND CURETTAGE /HYSTEROSCOPY;  Surgeon: Ward, Elenora Fender, MD;  Location: [facility] ORS;  Service: Gynecology;  Laterality: N/A;  . 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 RECTAL EXAM UNDER ANESTHESIA N/A 01/11/2018   Procedure: RECTAL EXAM UNDER ANESTHESIA;  Surgeon: Ward, Elenora Fender, MD;  Location: [facility] ORS;  Service: Gynecology;  Laterality: N/A;  . SPHINCTEROTOMY  01/11/2018   Procedure: SPHINCTEROTOMY;  Surgeon: Ward, Elenora Fender, MD;  Location: [facility] ORS;  Service: Gynecology;;  . TONSILLECTOMY      Allergies  Allergen Reactions  . Morphine And Related Hives, Shortness Of Breath and Swelling    Prior to Admission medications   Medication Sig Start Date End Date Taking? Authorizing Provider  acetaminophen (TYLENOL) 500 MG tablet Take 2 tablets (1,000 mg total) by mouth every 6 (six) hours as needed. Patient not taking: Reported  on 11/16/2019 09/09/19   Lisette Grinder, CNM  acyclovir (ZOVIRAX) 400 MG tablet Take 400 mg by mouth 3 (three) times daily. 11/03/19   [provider]  busPIRone (BUSPAR) 5 MG tablet Take 5 mg by mouth every morning.     [provider]  escitalopram (LEXAPRO) 10 MG tablet Take 10 mg by mouth every morning.     [provider]  fluticasone (FLONASE) 50 MCG/ACT nasal spray Place 2 sprays into both nostrils daily. Patient not taking: Reported on 09/05/2019 12/31/18   Lorin Picket, PA-C   Prenatal Vit-Fe Fumarate-FA (MULTIVITAMIN-PRENATAL) 27-0.8 MG TABS tablet Take 1 tablet by mouth daily at 12 noon.    [provider]  vitamin B-6 (PYRIDOXINE) 25 MG tablet Take 25 mg by mouth daily.    [provider]      Social History: She  reports that she quit smoking about 2 years ago. Her smoking use included cigarettes. She has a 2.00 pack-year smoking history. She has never used smokeless tobacco. She reports previous alcohol use. She reports that she does not use drugs.  Family History:  no history of gyn cancers  Review of Systems: A full review of systems was performed and negative except as noted in the HPI.     O:  BP 109/67 (BP Location: Left Arm)   Pulse 64   Temp 98.3 F (36.8 C) (Oral)   Resp 19   Ht 5\' 2"  (1.575 m)   Wt 93.9 kg   LMP 03/03/2019   BMI 37.86 kg/m  No results found for this or any previous visit (from the past 48 hour(s)).  Reviewed vitals, NST reviewed  Fetal  monitoring: Cat I Appropriate for GA Baseline: 140bpm Variability: moderate Accelerations: present x >2 Decelerations absent Time 51mins  TOCO: occasional UC noted    A/P: 28 y.o. [redacted]w[redacted]d here for antenatal surveillance for decreased fetal movement  Principle Diagnosis:  DFM, IUGR at 39wks   Labor: not present.   Fetal Wellbeing: Reassuring Cat 1 tracing with Reactive NST   Continue kick counts, arrive tomorrow as scheduled for CS.   D/c home stable, precautions reviewed, follow-up as scheduled.    Francetta Found, CNM 12/01/2019  3:50 PM

## 2019-12-01 NOTE — OB Triage Note (Signed)
Patient reported to L&D for evaluation with complaints of decreased fetal movement since yesterday.  Reports feeling some movement but states its nothing like she is use to feeling. Denies leaking of fluid or contractions.  Patient is scheduled for a c-section tomorrow morning.

## 2019-12-01 NOTE — H&P (Addendum)
OB History & Physical   History of Present Illness:  Chief Complaint:   HPI:  Angela Mckee is a 28 y.o. G53P2002 female at [redacted]w[redacted]d dated by 8wk ultrasound. Patient's last menstrual period was 03/03/2019. (inconsistent with dates) Estimated Date of Delivery: 12/08/19  She presents to L&D for scheduled 39wk cesarean  +FM, no CTX, no LOF, no VB  Pregnancy Issues: 1. Transfer of care late 2nd trimester 2. Obesity BMI 38 3. Hx HSV, on acyclovir 4. cHTN no meds 5. History of cesarean x2 6. Anxiety and depression, on Lexapro and Buspar 7. IUGR diagnosed 2 days ago.  10% ile. 8. H/o asthma 9. H/o gastric bypass    Maternal Medical History:   Past Medical History:  Diagnosis Date  . Asthma    WELL CONTROLLED  . Bradycardia    PT STATES THAT AT [facility] OFFICE Dec 25, 2017 THAT HER PULSE WAS 49-PT STATES SHE DOES HAVE OCC DIZZINESS.  HER LAST PULSE CHECKED IN DR WARDS OFFICE ON 01-04-18 WAS 80.  PT ASYMPTOMATIC OTHERWISE  . Complication of anesthesia    HARD TO WAKE UP  . DVT (deep venous thrombosis) ([place]) 2018   AFTER WEIGHT LOSS SURGERY  . GERD (gastroesophageal reflux disease)    NO MEDS  . Headache    MIGRAINES  . Hemorrhoids    PT HAVING HEMORRHOID BANDING IN OFFICE ON 01-07-18  . History of kidney stones    H/O  . Panic attack   . PONV (postoperative nausea and vomiting)     Past Surgical History:  Procedure Laterality Date  . ADENOIDECTOMY    . CESAREAN SECTION     X2  . CHOLECYSTECTOMY    . HYSTEROSCOPY W/D&C N/A 01/11/2018   Procedure: DILATATION AND CURETTAGE /HYSTEROSCOPY;  Surgeon: Hulda Reddix, Honor Loh, MD;  Location: [facility] ORS;  Service: Gynecology;  Laterality: N/A;  . 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 RECTAL EXAM UNDER ANESTHESIA N/A 01/11/2018   Procedure: RECTAL EXAM UNDER ANESTHESIA;  Surgeon: Fleurette Woolbright, Honor Loh, MD;  Location: [facility] ORS;  Service: Gynecology;  Laterality:  N/A;  . SPHINCTEROTOMY  01/11/2018   Procedure: SPHINCTEROTOMY;  Surgeon: Alixander Rallis, Honor Loh, MD;  Location: [facility] ORS;  Service: Gynecology;;  . TONSILLECTOMY      Allergies  Allergen Reactions  . Morphine And Related Hives, Shortness Of Breath and Swelling    Prior to Admission medications   Medication Sig Start Date End Date Taking? Authorizing Provider  acyclovir (ZOVIRAX) 400 MG tablet Take 400 mg by mouth 3 (three) times daily. 11/03/19  Yes [provider]  busPIRone (BUSPAR) 5 MG tablet Take 5 mg by mouth every morning.    Yes [provider]  escitalopram (LEXAPRO) 10 MG tablet Take 10 mg by mouth every morning.    Yes [provider]  Prenatal Vit-Fe Fumarate-FA (MULTIVITAMIN-PRENATAL) 27-0.8 MG TABS tablet Take 1 tablet by mouth daily at 12 noon.   Yes [provider]  vitamin B-6 (PYRIDOXINE) 25 MG tablet Take 25 mg by mouth daily.   Yes [provider]  acetaminophen (TYLENOL) 500 MG tablet Take 2 tablets (1,000 mg total) by mouth every 6 (six) hours as needed. Patient not taking: Reported on 11/16/2019 09/09/19   Lisette Grinder, CNM  fluticasone [facility]) 50 MCG/ACT nasal spray Place 2 sprays into both nostrils daily. Patient not taking: Reported on 09/05/2019 12/31/18   Lorin Picket,  PA-C     Prenatal care site: [facility]   Social History: She  reports that she quit smoking about 2 years ago. Her smoking use included cigarettes. She has a 2.00 pack-year smoking history. She has never used smokeless tobacco. She reports previous alcohol use. She reports that she does not use drugs.  Family History: family history is not on file.   Review of Systems: A full review of systems was performed and negative except as noted in the HPI.     Physical Exam:  Vital Signs: LMP 03/03/2019  General: no acute distress.  HEENT: normocephalic, atraumatic Heart: regular rate & rhythm.  No murmurs/rubs/gallops Lungs: clear to  auscultation bilaterally, normal respiratory effort Abdomen: soft, gravid, non-tender;  EFW: 8.0 Pelvic:   External: Normal external female genitalia  Cervix: n/a   Extremities: non-tender, symmetric, mild edema bilaterally.  DTRs: 2+  Neurologic: Alert & oriented x 3.    No results found for this or any previous visit (from the past 24 hour(s)).  Pertinent Results:  Prenatal Labs: Blood type/Rh AB+  Antibody screen neg  Rubella Immune  Varicella Immune  RPR NR  HBsAg Neg  HIV NR  GC neg  Chlamydia neg  Genetic screening negative  1 hour GTT 86  3 hour GTT --  GBS neg     Cephalic by leopolds  No results found.  Assessment:  Angela Mckee is a 28 y.o. G44P2002 female at [redacted]w[redacted]d with scheduled repeat cesarean, and IUGR diagnosed this week.   Plan:  1. Admit to Labor & Delivery 2. CBC, T&S, NPO, IVF 3. GBS neg  4. Consents obtained. 5. Continuous efm/toco until OR 6. TO OR WHEN READY (awaiting turn over of OR) 7. NICU aware of IUGR  ----- Ranae Plumber, MD Attending Obstetrician and Gynecologist [facility], Department of OB/GYN [facility]

## 2019-12-02 ENCOUNTER — Encounter: Payer: Self-pay | Admitting: Obstetrics & Gynecology

## 2019-12-02 ENCOUNTER — Inpatient Hospital Stay: Payer: Medicaid Other | Admitting: Certified Registered Nurse Anesthetist

## 2019-12-02 ENCOUNTER — Other Ambulatory Visit: Payer: Self-pay

## 2019-12-02 ENCOUNTER — Encounter
Admission: RE | Disposition: A | Discharge status: Home / Self Care | Payer: Self-pay | Source: Home / Self Care | Attending: Obstetrics & Gynecology

## 2019-12-02 ENCOUNTER — Inpatient Hospital Stay
Admission: RE | Admit: 2019-12-02 | Discharge: 2019-12-04 | DRG: 787 | Disposition: A | Discharge status: Home / Self Care | Payer: Medicaid Other | Attending: Obstetrics & Gynecology | Admitting: Obstetrics & Gynecology

## 2019-12-02 DIAGNOSIS — F32A Depression, unspecified: Secondary | ICD-10-CM | POA: Diagnosis present

## 2019-12-02 DIAGNOSIS — O9832 Other infections with a predominantly sexual mode of transmission complicating childbirth: Secondary | ICD-10-CM | POA: Diagnosis present

## 2019-12-02 DIAGNOSIS — O99844 Bariatric surgery status complicating childbirth: Secondary | ICD-10-CM | POA: Diagnosis present

## 2019-12-02 DIAGNOSIS — Z86718 Personal history of other venous thrombosis and embolism: Secondary | ICD-10-CM

## 2019-12-02 DIAGNOSIS — F329 Major depressive disorder, single episode, unspecified: Secondary | ICD-10-CM | POA: Diagnosis present

## 2019-12-02 DIAGNOSIS — J45909 Unspecified asthma, uncomplicated: Secondary | ICD-10-CM | POA: Diagnosis present

## 2019-12-02 DIAGNOSIS — O9952 Diseases of the respiratory system complicating childbirth: Secondary | ICD-10-CM | POA: Diagnosis present

## 2019-12-02 DIAGNOSIS — F410 Panic disorder [episodic paroxysmal anxiety] without agoraphobia: Secondary | ICD-10-CM | POA: Diagnosis present

## 2019-12-02 DIAGNOSIS — O9902 Anemia complicating childbirth: Secondary | ICD-10-CM | POA: Diagnosis present

## 2019-12-02 DIAGNOSIS — O1002 Pre-existing essential hypertension complicating childbirth: Secondary | ICD-10-CM | POA: Diagnosis present

## 2019-12-02 DIAGNOSIS — D649 Anemia, unspecified: Secondary | ICD-10-CM | POA: Diagnosis present

## 2019-12-02 DIAGNOSIS — A6000 Herpesviral infection of urogenital system, unspecified: Secondary | ICD-10-CM | POA: Diagnosis present

## 2019-12-02 DIAGNOSIS — O365930 Maternal care for other known or suspected poor fetal growth, third trimester, not applicable or unspecified: Secondary | ICD-10-CM | POA: Diagnosis present

## 2019-12-02 DIAGNOSIS — O34211 Maternal care for low transverse scar from previous cesarean delivery: Secondary | ICD-10-CM | POA: Diagnosis present

## 2019-12-02 DIAGNOSIS — O99344 Other mental disorders complicating childbirth: Secondary | ICD-10-CM | POA: Diagnosis present

## 2019-12-02 DIAGNOSIS — Z3A39 39 weeks gestation of pregnancy: Secondary | ICD-10-CM | POA: Diagnosis not present

## 2019-12-02 DIAGNOSIS — O99214 Obesity complicating childbirth: Secondary | ICD-10-CM | POA: Diagnosis present

## 2019-12-02 DIAGNOSIS — Z98891 History of uterine scar from previous surgery: Secondary | ICD-10-CM

## 2019-12-02 DIAGNOSIS — Z87891 Personal history of nicotine dependence: Secondary | ICD-10-CM

## 2019-12-02 DIAGNOSIS — E669 Obesity, unspecified: Secondary | ICD-10-CM | POA: Diagnosis present

## 2019-12-02 SURGERY — Surgical Case
Anesthesia: Spinal

## 2019-12-02 MED ORDER — SOD CITRATE-CITRIC ACID 500-334 MG/5ML PO SOLN
30.0000 mL | ORAL | Status: AC
  Administered 2019-12-02: 30 mL via ORAL
  Filled 2019-12-02: qty 30

## 2019-12-02 MED ORDER — ONDANSETRON HCL 4 MG/2ML IJ SOLN: 4.0000 mg | Freq: Three times a day (TID) | INTRAMUSCULAR | Status: DC | PRN

## 2019-12-02 MED ORDER — PRENATAL MULTIVITAMIN CH
1.0000 | ORAL_TABLET | Freq: Every day | ORAL | Status: DC
  Administered 2019-12-02 – 2019-12-04 (×3): 1 via ORAL
  Filled 2019-12-02 (×3): qty 1

## 2019-12-02 MED ORDER — NALBUPHINE HCL 10 MG/ML IJ SOLN: 2.0000 mg | INTRAMUSCULAR | Status: AC

## 2019-12-02 MED ORDER — GABAPENTIN 100 MG PO CAPS
100.0000 mg | ORAL_CAPSULE | Freq: Three times a day (TID) | ORAL | Status: DC
  Administered 2019-12-02 – 2019-12-04 (×7): 100 mg via ORAL
  Filled 2019-12-02 (×7): qty 1

## 2019-12-02 MED ORDER — SODIUM CHLORIDE (PF) 0.9 % IJ SOLN
INTRAMUSCULAR | Status: AC
  Filled 2019-12-02: qty 50

## 2019-12-02 MED ORDER — LACTATED RINGERS IV SOLN
Freq: Once | INTRAVENOUS | Status: AC
  Administered 2019-12-02: 08:00:00 via INTRAVENOUS

## 2019-12-02 MED ORDER — DIPHENHYDRAMINE HCL 50 MG/ML IJ SOLN
INTRAMUSCULAR | Status: AC
  Filled 2019-12-02: qty 2

## 2019-12-02 MED ORDER — ONDANSETRON HCL 4 MG/2ML IJ SOLN
INTRAMUSCULAR | Status: AC
  Filled 2019-12-02: qty 2

## 2019-12-02 MED ORDER — KETOROLAC TROMETHAMINE 30 MG/ML IJ SOLN
30.0000 mg | Freq: Four times a day (QID) | INTRAMUSCULAR | Status: DC
  Administered 2019-12-03 – 2019-12-04 (×5): 30 mg via INTRAVENOUS
  Filled 2019-12-02 (×5): qty 1

## 2019-12-02 MED ORDER — FENTANYL CITRATE (PF) 100 MCG/2ML IJ SOLN
INTRAMUSCULAR | Status: AC
  Filled 2019-12-02: qty 2

## 2019-12-02 MED ORDER — LACTATED RINGERS IV SOLN
Freq: Once | INTRAVENOUS | Status: AC
  Administered 2019-12-02: 07:00:00 via INTRAVENOUS

## 2019-12-02 MED ORDER — EPHEDRINE SULFATE 50 MG/ML IJ SOLN
INTRAMUSCULAR | Status: AC
  Filled 2019-12-02: qty 1

## 2019-12-02 MED ORDER — ONDANSETRON HCL 4 MG/2ML IJ SOLN
INTRAMUSCULAR | Status: DC | PRN
  Administered 2019-12-02: 4 mg via INTRAVENOUS

## 2019-12-02 MED ORDER — COCONUT OIL OIL
1.0000 "application " | TOPICAL_OIL | Status: DC | PRN
  Administered 2019-12-02: 1 via TOPICAL
  Filled 2019-12-02: qty 120

## 2019-12-02 MED ORDER — EPHEDRINE SULFATE 50 MG/ML IJ SOLN
INTRAMUSCULAR | Status: DC | PRN
  Administered 2019-12-02 (×5): 5 mg via INTRAVENOUS

## 2019-12-02 MED ORDER — KETOROLAC TROMETHAMINE 30 MG/ML IJ SOLN: 30.0000 mg | Freq: Four times a day (QID) | INTRAMUSCULAR | Status: DC

## 2019-12-02 MED ORDER — BUPIVACAINE HCL 0.5 % IJ SOLN
INTRAMUSCULAR | Status: DC | PRN
  Administered 2019-12-02: 30 mL

## 2019-12-02 MED ORDER — SIMETHICONE 80 MG PO CHEW
160.0000 mg | CHEWABLE_TABLET | Freq: Four times a day (QID) | ORAL | Status: DC | PRN
  Administered 2019-12-02 – 2019-12-03 (×3): 160 mg via ORAL
  Filled 2019-12-02 (×3): qty 2

## 2019-12-02 MED ORDER — KETOROLAC TROMETHAMINE 30 MG/ML IJ SOLN
30.0000 mg | Freq: Four times a day (QID) | INTRAMUSCULAR | Status: DC
  Administered 2019-12-02 (×3): 30 mg via INTRAVENOUS
  Filled 2019-12-02 (×3): qty 1

## 2019-12-02 MED ORDER — SODIUM CHLORIDE 0.9 % IV SOLN
INTRAVENOUS | Status: DC | PRN
  Administered 2019-12-02: 09:00:00 70 mL

## 2019-12-02 MED ORDER — FENTANYL CITRATE (PF) 100 MCG/2ML IJ SOLN
INTRAMUSCULAR | Status: DC | PRN
  Administered 2019-12-02: 15 ug via INTRATHECAL

## 2019-12-02 MED ORDER — BUPIVACAINE IN DEXTROSE 0.75-8.25 % IT SOLN
INTRATHECAL | Status: DC | PRN
  Administered 2019-12-02: 1.6 mL via INTRATHECAL

## 2019-12-02 MED ORDER — NALBUPHINE HCL 10 MG/ML IJ SOLN
INTRAMUSCULAR | Status: AC
  Administered 2019-12-02: 11:00:00 2 mg via INTRAVENOUS
  Filled 2019-12-02: qty 1

## 2019-12-02 MED ORDER — ESCITALOPRAM OXALATE 10 MG PO TABS
10.0000 mg | ORAL_TABLET | ORAL | Status: DC
  Administered 2019-12-02 – 2019-12-03 (×2): 10 mg via ORAL
  Filled 2019-12-02 (×2): qty 1

## 2019-12-02 MED ORDER — MENTHOL 3 MG MT LOZG
1.0000 | LOZENGE | OROMUCOSAL | Status: DC | PRN
  Filled 2019-12-02: qty 9

## 2019-12-02 MED ORDER — WITCH HAZEL-GLYCERIN EX PADS: 1.0000 "application " | MEDICATED_PAD | CUTANEOUS | Status: DC | PRN

## 2019-12-02 MED ORDER — LACTATED RINGERS IV SOLN: INTRAVENOUS | Status: DC

## 2019-12-02 MED ORDER — ACETAMINOPHEN 500 MG PO TABS
1000.0000 mg | ORAL_TABLET | Freq: Four times a day (QID) | ORAL | Status: DC
  Administered 2019-12-02 – 2019-12-04 (×8): 1000 mg via ORAL
  Filled 2019-12-02 (×9): qty 2

## 2019-12-02 MED ORDER — SODIUM CHLORIDE 0.9% FLUSH: 3.0000 mL | Freq: Two times a day (BID) | INTRAVENOUS | Status: DC

## 2019-12-02 MED ORDER — NALOXONE HCL 0.4 MG/ML IJ SOLN: 0.4000 mg | INTRAMUSCULAR | Status: DC | PRN

## 2019-12-02 MED ORDER — SODIUM CHLORIDE 0.9% FLUSH: 3.0000 mL | INTRAVENOUS | Status: DC | PRN

## 2019-12-02 MED ORDER — OXYCODONE HCL 5 MG PO TABS
5.0000 mg | ORAL_TABLET | ORAL | Status: DC | PRN
  Administered 2019-12-03: 10 mg via ORAL
  Administered 2019-12-03: 5 mg via ORAL
  Administered 2019-12-03 (×3): 10 mg via ORAL
  Filled 2019-12-02 (×4): qty 2
  Filled 2019-12-02: qty 1
  Filled 2019-12-02: qty 2

## 2019-12-02 MED ORDER — SODIUM CHLORIDE 0.9 % IV SOLN: 250.0000 mL | INTRAVENOUS | Status: DC

## 2019-12-02 MED ORDER — BUPIVACAINE HCL (PF) 0.5 % IJ SOLN
30.0000 mL | INTRAMUSCULAR | Status: DC
  Filled 2019-12-02: qty 30

## 2019-12-02 MED ORDER — PHENYLEPHRINE HCL (PRESSORS) 10 MG/ML IV SOLN
INTRAVENOUS | Status: DC | PRN
  Administered 2019-12-02: 100 ug via INTRAVENOUS

## 2019-12-02 MED ORDER — DIPHENHYDRAMINE HCL 50 MG/ML IJ SOLN
INTRAMUSCULAR | Status: DC | PRN
  Administered 2019-12-02: 12.5 mg via INTRAVENOUS

## 2019-12-02 MED ORDER — DIPHENHYDRAMINE HCL 25 MG PO CAPS: 25.0000 mg | ORAL_CAPSULE | Freq: Four times a day (QID) | ORAL | Status: DC | PRN

## 2019-12-02 MED ORDER — SODIUM CHLORIDE 0.9 % IV SOLN
INTRAVENOUS | Status: DC | PRN
  Administered 2019-12-02: 50 ug/min via INTRAVENOUS

## 2019-12-02 MED ORDER — OXYCODONE HCL 5 MG PO TABS
10.0000 mg | ORAL_TABLET | ORAL | Status: DC | PRN
  Administered 2019-12-02 (×2): 10 mg via ORAL
  Filled 2019-12-02: qty 2

## 2019-12-02 MED ORDER — NALBUPHINE HCL 10 MG/ML IJ SOLN
INTRAMUSCULAR | Status: AC
  Filled 2019-12-02: qty 1

## 2019-12-02 MED ORDER — MORPHINE SULFATE (PF) 0.5 MG/ML IJ SOLN
INTRAMUSCULAR | Status: AC
  Filled 2019-12-02: qty 10

## 2019-12-02 MED ORDER — OXYCODONE HCL 5 MG PO TABS: 5.0000 mg | ORAL_TABLET | ORAL | Status: DC | PRN

## 2019-12-02 MED ORDER — OXYTOCIN 40 UNITS IN NORMAL SALINE INFUSION - SIMPLE MED
INTRAVENOUS | Status: AC
  Administered 2019-12-02: 2.5 [IU]/h via INTRAVENOUS
  Filled 2019-12-02: qty 1000

## 2019-12-02 MED ORDER — OXYTOCIN 40 UNITS IN NORMAL SALINE INFUSION - SIMPLE MED
INTRAVENOUS | Status: AC
  Filled 2019-12-02: qty 1000

## 2019-12-02 MED ORDER — DIBUCAINE (PERIANAL) 1 % EX OINT: 1.0000 "application " | TOPICAL_OINTMENT | CUTANEOUS | Status: DC | PRN

## 2019-12-02 MED ORDER — MEPERIDINE HCL 25 MG/ML IJ SOLN: 6.2500 mg | INTRAMUSCULAR | Status: DC | PRN

## 2019-12-02 MED ORDER — ACETAMINOPHEN 325 MG PO TABS: 650.0000 mg | ORAL_TABLET | Freq: Four times a day (QID) | ORAL | Status: DC

## 2019-12-02 MED ORDER — DEXAMETHASONE SODIUM PHOSPHATE 4 MG/ML IJ SOLN
INTRAMUSCULAR | Status: DC | PRN
  Administered 2019-12-02: 10 mg via INTRAVENOUS

## 2019-12-02 MED ORDER — SENNOSIDES-DOCUSATE SODIUM 8.6-50 MG PO TABS
2.0000 | ORAL_TABLET | ORAL | Status: DC
  Administered 2019-12-02 – 2019-12-04 (×2): 2 via ORAL
  Filled 2019-12-02 (×2): qty 2

## 2019-12-02 MED ORDER — BUSPIRONE HCL 5 MG PO TABS
5.0000 mg | ORAL_TABLET | ORAL | Status: DC
  Administered 2019-12-02 – 2019-12-04 (×3): 5 mg via ORAL
  Filled 2019-12-02 (×3): qty 1

## 2019-12-02 MED ORDER — BUPIVACAINE LIPOSOME 1.3 % IJ SUSP
20.0000 mL | INTRAMUSCULAR | Status: DC
  Filled 2019-12-02: qty 20

## 2019-12-02 MED ORDER — OXYTOCIN 40 UNITS IN NORMAL SALINE INFUSION - SIMPLE MED
INTRAVENOUS | Status: DC | PRN
  Administered 2019-12-02: 900 mL via INTRAVENOUS

## 2019-12-02 MED ORDER — CEFAZOLIN SODIUM-DEXTROSE 2-4 GM/100ML-% IV SOLN
2.0000 g | INTRAVENOUS | Status: AC
  Administered 2019-12-02: 2 g via INTRAVENOUS
  Filled 2019-12-02: qty 100

## 2019-12-02 MED ORDER — ACETAMINOPHEN 650 MG RE SUPP
650.0000 mg | RECTAL | Status: DC
  Filled 2019-12-02: qty 1

## 2019-12-02 MED ORDER — OXYTOCIN 40 UNITS IN NORMAL SALINE INFUSION - SIMPLE MED: 2.5000 [IU]/h | INTRAVENOUS | Status: DC

## 2019-12-02 SURGICAL SUPPLY — 37 items
BARRIER ADHS 3X4 INTERCEED (GAUZE/BANDAGES/DRESSINGS) ×3 IMPLANT
BINDER ABDOMINAL 12 ML 46-62 (SOFTGOODS) ×3 IMPLANT
CANISTER SUCT 3000ML PPV (MISCELLANEOUS) ×3 IMPLANT
CLOSURE WOUND 1/2 X4 (GAUZE/BANDAGES/DRESSINGS) ×1
COVER WAND RF STERILE (DRAPES) ×3 IMPLANT
DERMABOND ADVANCED (GAUZE/BANDAGES/DRESSINGS) ×2
DERMABOND ADVANCED .7 DNX12 (GAUZE/BANDAGES/DRESSINGS) ×1 IMPLANT
DRSG TELFA 3X8 NADH (GAUZE/BANDAGES/DRESSINGS) ×3 IMPLANT
ELECT CAUTERY BLADE 6.4 (BLADE) ×3 IMPLANT
ELECT REM PT RETURN 9FT ADLT (ELECTROSURGICAL) ×3
ELECTRODE REM PT RTRN 9FT ADLT (ELECTROSURGICAL) ×1 IMPLANT
GAUZE SPONGE 4X4 12PLY STRL (GAUZE/BANDAGES/DRESSINGS) ×3 IMPLANT
GLOVE PI ORTHOPRO 6.5 (GLOVE) ×2
GLOVE PI ORTHOPRO STRL 6.5 (GLOVE) ×1 IMPLANT
GLOVE SURG SYN 6.5 ES PF (GLOVE) ×18 IMPLANT
GOWN STRL REUS W/ TWL LRG LVL3 (GOWN DISPOSABLE) ×3 IMPLANT
GOWN STRL REUS W/TWL LRG LVL3 (GOWN DISPOSABLE) ×6
NS IRRIG 1000ML POUR BTL (IV SOLUTION) ×3 IMPLANT
PACK C SECTION AR (MISCELLANEOUS) ×3 IMPLANT
PAD OB MATERNITY 4.3X12.25 (PERSONAL CARE ITEMS) ×3 IMPLANT
PAD PREP 24X41 OB/GYN DISP (PERSONAL CARE ITEMS) ×3 IMPLANT
PENCIL SMOKE ULTRAEVAC 22 CON (MISCELLANEOUS) ×3 IMPLANT
RETRACTOR TRAXI PANNICULUS (MISCELLANEOUS) ×1 IMPLANT
STAPLER INSORB 30 2030 C-SECTI (MISCELLANEOUS) ×3 IMPLANT
STRAP SAFETY 5IN WIDE (MISCELLANEOUS) ×3 IMPLANT
STRIP CLOSURE SKIN 1/2X4 (GAUZE/BANDAGES/DRESSINGS) ×2 IMPLANT
SUT MNCRL 4-0 (SUTURE) ×2
SUT MNCRL 4-0 27XMFL (SUTURE) ×1
SUT PDS AB 1 TP1 96 (SUTURE) ×3 IMPLANT
SUT VIC AB 0 CT1 36 (SUTURE) ×6 IMPLANT
SUT VIC AB 2-0 CT1 27 (SUTURE) ×2
SUT VIC AB 2-0 CT1 TAPERPNT 27 (SUTURE) ×1 IMPLANT
SUT VIC AB 3-0 SH 27 (SUTURE) ×2
SUT VIC AB 3-0 SH 27X BRD (SUTURE) ×1 IMPLANT
SUTURE MNCRL 4-0 27XMF (SUTURE) ×1 IMPLANT
SWABSTK COMLB BENZOIN TINCTURE (MISCELLANEOUS) ×3 IMPLANT
TRAXI PANNICULUS RETRACTOR (MISCELLANEOUS) ×2

## 2019-12-02 NOTE — Op Note (Addendum)
Cesarean Section Procedure Note  12/02/2019   Patient:  Angela Mckee  28 y.o. female at [redacted]w[redacted]d.  Patient's last menstrual period was 03/03/2019. Preoperative diagnosis:  previous c-section Postoperative diagnosis:  previous c-section  PROCEDURE:  Procedure(s): REPEAT CESAREAN SECTION (N/A) Surgeon:  Surgeon(s) and Role:    * Yitzchak Kothari, Honor Loh, MD - Primary Anesthesia:  spinal I/O: Total I/O In: 900 [I.V.:900] Out: 54 [Urine:150; Blood:655] Specimens:  Cord Blood, placenta  Complications: None Apparent Disposition:  VS stable to PACU  Findings: normal uterus, tubes and ovaries bilaterally Live born female  Birth Weight: 6 lb 0.3 oz (2730 g) APGAR: 9, 9 Small band of tissue connecting loops of bowel to each other Omentum with 7cm loop of suture without contents.  Newborn Delivery   Birth date/time: 12/02/2021 08:32:00 Delivery type: C-Section, Low Transverse Trial of labor: No C-section categorization: Repeat       Indication for procedure: 28 y.o. female at [redacted]w[redacted]d with history of previous cesarean, at 39 weeks for scheduled repeat.  Procedure Details   The risks, benefits, complications, treatment options, and expected outcomes were discussed with the patient. Informed consent was obtained. The patient was taken to Operating Room, identified as Stann Mainland and the procedure verified as a cesarean delivery.   After administration of anesthesia, the patient was prepped and draped in the usual sterile manner, including a vaginal prep. A surgical time out was performed, with the pediatric team present. After confirming adequate anesthesia, a Pfannenstiel incision was made and carried down through the subcutaneous tissue to the fascia. Fascial incision was made and extended transversely. The fascia was separated from the underlying rectus tissue superiorly and inferiorly. The rectus muscles were divided in the midline. The peritoneum was identified and entered.  Peritoneal incision was extended longitudinally.  A low transverse uterine incision was made. Delivered from cephalic presentation was a live born female. Delayed cord clamping was performed for 60 seconds during which we sang happy birthday to baby [place]. The umbilical cord was doubly clamped and cut, and the baby was handed off to the awaitng pediatrician.  Cord blood was obtained for evaluation. The placenta was removed intact and appeared normal. The uterus was delivered from the abdominal cavity and cleared of clots, membranes, and debris. The uterus, tubes and ovaries appeared normal. The uterine incision was closed with running locking sutures of 0 Vicryl, and then a second, imbricating stitch was placed. Hemostasis was observed. The abdominal cavity was evacuated of extraneous fluid. The uterus was returned to the abdominal cavity and again the incision was inspected for hemostasis, which was confirmed.  The paracolic gutters were cleaned. The fascia was then reapproximated with running suture of vicryl. 90cc of Long- and short-acting bupivicaine was injected circumferentially into the fascia.  After a change of gloves, the subcutaneous tissue was irrigated and reapproximated with 3-0 vicryl. The skin was closed with 4-0 Monocryl and 10cc of long- and short-acting bupivacaine injected into the skin and subcutaneous tissues.  The incision was covered with surgical glue.     Instrument, sponge, and needle counts were correct prior the abdominal closure and at the conclusion of the case.   I was present and performed this procedure in its entirety.  VTE: SCDs Perioperative antibiotics: Ancef 2g  ----- Larey Days, MD Attending Obstetrician and Gynecologist [facility], Department of [facility]

## 2019-12-02 NOTE — Anesthesia Preprocedure Evaluation (Signed)
Anesthesia Evaluation  Patient identified by MRN, date of birth, ID band Patient awake    Reviewed: Allergy & Precautions, NPO status , Patient's Chart, lab work & pertinent test results  History of Anesthesia Complications (+) PONV and history of anesthetic complications  Airway Mallampati: II  TM Distance: >3 FB Neck ROM: Full    Dental no notable dental hx.    Pulmonary asthma (hasn't used inhaler for a couple years) , neg sleep apnea, former smoker,    breath sounds clear to auscultation- rhonchi (-) wheezing      Cardiovascular Exercise Tolerance: Good (-) hypertension(-) CAD, (-) Past MI, (-) Cardiac Stents and (-) CABG  Rhythm:Regular Rate:Normal - Systolic murmurs and - Diastolic murmurs    Neuro/Psych  Headaches, neg Seizures Anxiety    GI/Hepatic Neg liver ROS, GERD  ,  Endo/Other  negative endocrine ROSneg diabetes  Renal/GU negative Renal ROS     Musculoskeletal negative musculoskeletal ROS (+)   Abdominal (+) + obese,   Peds  Hematology negative hematology ROS (+)   Anesthesia Other Findings Past Medical History: No date: Asthma     Comment:  WELL CONTROLLED No date: Bradycardia     Comment:  PT STATES THAT AT DR Windell Moment OFFICE Dec 25, 2017 THAT               HER PULSE WAS 49-PT STATES SHE DOES HAVE OCC DIZZINESS.                HER LAST PULSE CHECKED IN DR WARDS OFFICE ON 01-04-18 WAS               71.  PT ASYMPTOMATIC OTHERWISE No date: Complication of anesthesia     Comment:  HARD TO WAKE UP 2018: DVT (deep venous thrombosis) ([facility])     Comment:  AFTER WEIGHT LOSS SURGERY No date: GERD (gastroesophageal reflux disease)     Comment:  NO MEDS No date: Headache     Comment:  MIGRAINES No date: Hemorrhoids     Comment:  PT HAVING HEMORRHOID BANDING IN OFFICE ON 01-07-18 No date: History of kidney stones     Comment:  H/O No date: Panic attack No date: PONV (postoperative nausea and vomiting)   Reproductive/Obstetrics (+) Pregnancy                             Lab Results  Component Value Date   WBC 9.0 11/29/2019   HGB 12.7 11/29/2019   HCT 36.8 11/29/2019   MCV 90.2 11/29/2019   PLT 184 11/29/2019    Anesthesia Physical Anesthesia Plan  ASA: II  Anesthesia Plan: Spinal   Post-op Pain Management:    Induction:   PONV Risk Score and Plan: 3 and Ondansetron and Treatment may vary due to age or medical condition  Airway Management Planned: Natural Airway  Additional Equipment:   Intra-op Plan:   Post-operative Plan:   Informed Consent: I have reviewed the patients History and Physical, chart, labs and discussed the procedure including the risks, benefits and alternatives for the proposed anesthesia with the patient or authorized representative who has indicated his/her understanding and acceptance.     Dental advisory given  Plan Discussed with: CRNA and Anesthesiologist  Anesthesia Plan Comments:         Anesthesia Quick Evaluation

## 2019-12-02 NOTE — Lactation Note (Signed)
This note was copied from a baby's chart. Lactation Consultation Note  Patient Name: Angela Mckee Today's Date: 12/02/2019 Reason for consult: Initial assessment;1st time breastfeeding   Maternal Data Formula Feeding for Exclusion: No Does the patient have breastfeeding experience prior to this delivery?: Yes  Feeding Feeding Type: Breast Fed  LATCH Score Latch: Grasps breast easily, tongue down, lips flanged, rhythmical sucking.  Audible Swallowing: A few with stimulation  Type of Nipple: Everted at rest and after stimulation  Comfort (Breast/Nipple): Soft / non-tender  Hold (Positioning): Assistance needed to correctly position infant at breast and maintain latch.  LATCH Score: 8  Interventions Interventions: Breast feeding basics reviewed;Assisted with latch;Skin to skin;Hand express;Breast compression;Support pillows  Lactation Tools Discussed/Used     Consult Status Consult Status: Follow-up Date: 12/02/19 Follow-up type: In-patient    Ferol Luz 12/02/2019, 11:10 AM

## 2019-12-02 NOTE — Anesthesia Procedure Notes (Signed)
Spinal  Patient location during procedure: OR Start time: 12/02/2019 7:49 AM End time: 12/02/2019 7:51 AM Staffing Performed: other anesthesia staff  Anesthesiologist: Emmie Niemann, MD Resident/CRNA: Rolla Plate, CRNA Other anesthesia staff: Norm Salt, RN Preanesthetic Checklist Completed: patient identified, IV checked, site marked, risks and benefits discussed, surgical consent, monitors and equipment checked, pre-op evaluation and timeout performed Spinal Block Patient position: sitting Prep: ChloraPrep Patient monitoring: heart rate, continuous pulse ox and blood pressure Approach: midline Location: L4-5 Injection technique: single-shot Needle Needle type: Introducer and Pencil-Tip  Needle gauge: 24 G Needle length: 9 cm Additional Notes Negative paresthesia. Negative blood return. Positive free-flowing CSF. Expiration date of kit checked and confirmed. Patient tolerated procedure well, without complications.

## 2019-12-02 NOTE — Lactation Note (Signed)
This note was copied from a baby's chart. Lactation Consultation Note  Patient Name: Girl Keandrea Tapley [id number] Date: 12/02/2019 Reason for consult: Follow-up assessment Entered room and baby sucking on pacifier, recommended mom to attempt latching baby to breast, baby unable to coordinate suck to latch after several tries, placed skin to skin and advised mom to attempt when baby starts showing cues again, baby calm at present , advised not to use pacifier at this time as baby is learning to nurse, advised may use pacifier again when breastfeeding well established    Maternal Data    Feeding Feeding Type: Breast Fed  LATCH Score Latch: Too sleepy or reluctant, no latch achieved, no sucking elicited.(baby sucking on pacifier, uncoordinated on breast)                 Interventions Interventions: Skin to skin;Hand express  Lactation Tools Discussed/Used     Consult Status Consult Status: Follow-up Date: 12/03/19 Follow-up type: In-patient    Ferol Luz 12/02/2019, 7:45 PM

## 2019-12-02 NOTE — Anesthesia Post-op Follow-up Note (Signed)
Anesthesia QCDR form completed.        

## 2019-12-02 NOTE — Transfer of Care (Signed)
Immediate Anesthesia Transfer of Care Note  Patient: Angela Mckee  Procedure(s) Performed: REPEAT CESAREAN SECTION (N/A )  Patient Location: Mother/Baby  Anesthesia Type:Spinal  Level of Consciousness: awake, alert  and oriented  Airway & Oxygen Therapy: Patient Spontanous Breathing  Post-op Assessment: Report given to RN and Post -op Vital signs reviewed and stable  Post vital signs: Reviewed  Last Vitals:  Vitals Value Taken Time  BP 101/77 12/02/19 0944  Temp 36.7 C 12/02/19 0944  Pulse 74 12/02/19 0944  Resp 15 12/02/19 0944  SpO2 100 % 12/02/19 0944  Vitals shown include unvalidated device data.  Last Pain:  Vitals:   12/02/19 0555  TempSrc:   PainSc: 0-No pain         Complications: No apparent anesthesia complications

## 2019-12-03 LAB — CBC
HCT: 28.7 % — ABNORMAL LOW (ref 36.0–46.0)
Hemoglobin: 9.9 g/dL — ABNORMAL LOW (ref 12.0–15.0)
MCH: 31.5 pg (ref 26.0–34.0)
MCHC: 34.5 g/dL (ref 30.0–36.0)
MCV: 91.4 fL (ref 80.0–100.0)
Platelets: 149 10*3/uL — ABNORMAL LOW (ref 150–400)
RBC: 3.14 MIL/uL — ABNORMAL LOW (ref 3.87–5.11)
RDW: 12.1 % (ref 11.5–15.5)
WBC: 14.9 10*3/uL — ABNORMAL HIGH (ref 4.0–10.5)
nRBC: 0 % (ref 0.0–0.2)

## 2019-12-03 MED ORDER — ESCITALOPRAM OXALATE 10 MG PO TABS
20.0000 mg | ORAL_TABLET | ORAL | Status: DC
  Administered 2019-12-04: 20 mg via ORAL
  Filled 2019-12-03: qty 2

## 2019-12-03 MED ORDER — FERROUS SULFATE 325 (65 FE) MG PO TABS
325.0000 mg | ORAL_TABLET | Freq: Two times a day (BID) | ORAL | Status: DC
  Administered 2019-12-03 – 2019-12-04 (×2): 325 mg via ORAL
  Filled 2019-12-03 (×2): qty 1

## 2019-12-03 NOTE — Progress Notes (Signed)
Edinburgh = 14. Taking lexapro, 10mg .  Is requesting increase dose.  She has history of postpartum depression, wants to be ahead of the game as she anticipates it happening again.  Will have her adjusted to 20mg  starting today.  ----- Larey Days, MD, [place] Attending Obstetrician and Gynecologist [facility], Department of [facility]

## 2019-12-03 NOTE — Progress Notes (Signed)
CSW received consult for MOB due to elevated EPDS score of 14. CSW spoke with MOB to complete discussion. MOB reports she completed the assessment last night and does not feel as anxious today. MOB confirms her history of anxiety and depression. MOB reports having postpartum depression with her first child 13 years ago. MOB reports that her Buspar and Lexapro help tremendously. MOB reports she plans to have a conversation about upping her dose of Lexapro with her MD to prevent any PPD from reoccurring. MOB reports having a good support system of her fiance, family, and friends. MOB denies any therapist or counselor, and stated she knew how to find one if needed. MOB states her mood is positive and stable since delivery. MOB denies any concerns at this time. CSW encouraged MOB to reach out for assistance if needs arise, she stated agreement.  Polina Burmaster, MSW, LCSW-A Transitions of Care  Clinical Social Worker  [facility]  Medical ICU [phone]   

## 2019-12-03 NOTE — Progress Notes (Signed)
Subjective: Postpartum Day 1: Cesarean Delivery for IUGR Patient reports tolerating PO, + flatus and no problems voiding.    Objective: Vital signs in last 24 hours: Temp:  [96.7 F (35.9 C)-98.8 F (37.1 C)] 98 F (36.7 C) (12/12 0740) Pulse Rate:  [57-104] 63 (12/12 0740) Resp:  [0-32] 20 (12/12 0740) BP: (98-117)/(58-77) 105/61 (12/12 0740) SpO2:  [89 %-100 %] 98 % (12/12 0740)  Physical Exam:  General: alert and cooperative Lochia: appropriate Uterine Fundus: firm Incision: Dressing intact and dry DVT Evaluation: No evidence of DVT seen on physical exam.  Recent Labs    12/03/19 0423  HGB 9.9*  HCT 28.7*    Assessment/Plan: Status post Cesarean section. Doing well postoperatively.  Asymptomatic Anemia - Fe supplementation Continue current care.  Jenifer E Naomie Crow 12/03/2019, 9:32 AM

## 2019-12-04 ENCOUNTER — Ambulatory Visit: Payer: Self-pay

## 2019-12-04 DIAGNOSIS — F329 Major depressive disorder, single episode, unspecified: Secondary | ICD-10-CM | POA: Diagnosis present

## 2019-12-04 DIAGNOSIS — Z98891 History of uterine scar from previous surgery: Secondary | ICD-10-CM

## 2019-12-04 DIAGNOSIS — F32A Depression, unspecified: Secondary | ICD-10-CM | POA: Diagnosis present

## 2019-12-04 NOTE — Discharge Instructions (Signed)
Postpartum Care After Cesarean Delivery This sheet gives you information about how to care for yourself from the time you deliver your baby to up to 6-12 weeks after delivery (postpartum period). Your health care provider may also give you more specific instructions. If you have problems or questions, contact your health care provider. Follow these instructions at home: Medicines  Take over-the-counter and prescription medicines only as told by your health care provider.  If you were prescribed an antibiotic medicine, take it as told by your health care provider. Do not stop taking the antibiotic even if you start to feel better.  Ask your health care provider if the medicine prescribed to you: ? Requires you to avoid driving or using heavy machinery. ? Can cause constipation. You may need to take actions to prevent or treat constipation, such as:  Drink enough fluid to keep your urine pale yellow.  Take over-the-counter or prescription medicines.  Eat foods that are high in fiber, such as beans, whole grains, and fresh fruits and vegetables.  Limit foods that are high in fat and processed sugars, such as fried or sweet foods. Activity  Gradually return to your normal activities as told by your health care provider.  Avoid activities that take a lot of effort and energy (are strenuous) until approved by your health care provider. Walking at a slow to moderate pace is usually safe. Ask your health care provider what activities are safe for you. ? Do not lift anything that is heavier than your baby or 10 lb (4.5 kg) as told by your health care provider. ? Do not vacuum, climb stairs, or drive a car for as long as told by your health care provider.  If possible, have someone help you at home until you are able to do your usual activities yourself.  Rest as much as possible. Try to rest or take naps while your baby is sleeping. Vaginal bleeding  It is normal to have vaginal bleeding  (lochia) after delivery. Wear a sanitary pad to absorb vaginal bleeding and discharge. ? During the first week after delivery, the amount and appearance of lochia is often similar to a menstrual period. ? Over the next few weeks, it will gradually decrease to a dry, yellow-brown discharge. ? For most women, lochia stops completely by 4-6 weeks after delivery. Vaginal bleeding can vary from woman to woman.  Change your sanitary pads frequently. Watch for any changes in your flow, such as: ? A sudden increase in volume. ? A change in color. ? Large blood clots.  If you pass a blood clot, save it and call your health care provider to discuss. Do not flush blood clots down the toilet before you get instructions from your health care provider.  Do not use tampons or douches until your health care provider says this is safe.  If you are not breastfeeding, your period should return 6-8 weeks after delivery. If you are breastfeeding, your period may return anytime between 8 weeks after delivery and the time that you stop breastfeeding. Perineal care   If your C-section (Cesarean section) was unplanned, and you were allowed to labor and push before delivery, you may have pain, swelling, and discomfort of the tissue between your vaginal opening and your anus (perineum). You may also have an incision in the tissue (episiotomy) or the tissue may have torn during delivery. Follow these instructions as told by your health care provider: ? Keep your perineum clean and dry as told by   your health care provider. Use medicated pads and pain-relieving sprays and creams as directed. ? If you have an episiotomy or vaginal tear, check the area every day for signs of infection. Check for:  Redness, swelling, or pain.  Fluid or blood.  Warmth.  Pus or a bad smell. ? You may be given a squirt bottle to use instead of wiping to clean the perineum area after you go to the bathroom. As you start healing, you may use  the squirt bottle before wiping yourself. Make sure to wipe gently. ? To relieve pain caused by an episiotomy, vaginal tear, or hemorrhoids, try taking a warm sitz bath 2-3 times a day. A sitz bath is a warm water bath that is taken while you are sitting down. The water should only come up to your hips and should cover your buttocks. Breast care  Within the first few days after delivery, your breasts may feel heavy, full, and uncomfortable (breast engorgement). You may also have milk leaking from your breasts. Your health care provider can suggest ways to help relieve breast discomfort. Breast engorgement should go away within a few days.  If you are breastfeeding: ? Wear a bra that supports your breasts and fits you well. ? Keep your nipples clean and dry. Apply creams and ointments as told by your health care provider. ? You may need to use breast pads to absorb milk leakage. ? You may have uterine contractions every time you breastfeed for several weeks after delivery. Uterine contractions help your uterus return to its normal size. ? If you have any problems with breastfeeding, work with your health care provider or a lactation consultant.  If you are not breastfeeding: ? Avoid touching your breasts as this can make your breasts produce more milk. ? Wear a well-fitting bra and use cold packs to help with swelling. ? Do not squeeze out (express) milk. This causes you to make more milk. Intimacy and sexuality  Ask your health care provider when you can engage in sexual activity. This may depend on your: ? Risk of infection. ? Healing rate. ? Comfort and desire to engage in sexual activity.  You are able to get pregnant after delivery, even if you have not had your period. If desired, talk with your health care provider about methods of family planning or birth control (contraception). Lifestyle  Do not use any products that contain nicotine or tobacco, such as cigarettes, e-cigarettes,  and chewing tobacco. If you need help quitting, ask your health care provider.  Do not drink alcohol, especially if you are breastfeeding. Eating and drinking   Drink enough fluid to keep your urine pale yellow.  Eat high-fiber foods every day. These may help prevent or relieve constipation. High-fiber foods include: ? Whole grain cereals and breads. ? Brown rice. ? Beans. ? Fresh fruits and vegetables.  Take your prenatal vitamins until your postpartum checkup or until your health care provider tells you it is okay to stop. General instructions  Keep all follow-up visits for you and your baby as told by your health care provider. Most women visit their health care provider for a postpartum checkup within the first 3-6 weeks after delivery. Contact a health care provider if you:  Feel unable to cope with the changes that a new baby brings to your life, and these feelings do not go away.  Feel unusually sad or worried.  Have breasts that are painful, hard, or turn red.  Have a fever.    Have trouble holding urine or keeping urine from leaking.  Have little or no interest in activities you used to enjoy.  Have not breastfed at all and you have not had a menstrual period for 12 weeks after delivery.  Have stopped breastfeeding and you have not had a menstrual period for 12 weeks after you stopped breastfeeding.  Have questions about caring for yourself or your baby.  Pass a blood clot from your vagina. Get help right away if you:  Have chest pain.  Have difficulty breathing.  Have sudden, severe leg pain.  Have severe pain or cramping in your abdomen.  Bleed from your vagina so much that you fill more than one sanitary pad in one hour. Bleeding should not be heavier than your heaviest period.  Develop a severe headache.  Faint.  Have blurred vision or spots in your vision.  Have a bad-smelling vaginal discharge.  Have thoughts about hurting yourself or your  baby. If you ever feel like you may hurt yourself or others, or have thoughts about taking your own life, get help right away. You can go to your nearest emergency department or call:  Your local emergency services (911 in the [place]  A suicide crisis helpline, such as the [place] at (704)354-9413. This is open 24 hours a day. Summary  The period of time from when you deliver your baby to up to 6-12 weeks after delivery is called the postpartum period.  Gradually return to your normal activities as told by your health care provider.  Keep all follow-up visits for you and your baby as told by your health care provider. This information is not intended to replace advice given to you by your health care provider. Make sure you discuss any questions you have with your health care provider. Document Released: 12/05/2000 Document Revised: 07/28/2018 Document Reviewed: 07/28/2018 Elsevier Patient Education  2020 [employer].   Postpartum Baby Blues The postpartum period begins right after the birth of a baby. During this time, there is often a lot of joy and excitement. It is also a time of many changes in the life of the parents. No matter how many times a mother gives birth, each child brings new challenges to the family, including different ways of relating to one another. It is common to have feelings of excitement along with confusing changes in moods, emotions, and thoughts. You may feel happy one minute and sad or stressed the next. These feelings of sadness usually happen in the period right after you have your baby, and they go away within a week or two. This is called the "baby blues." What are the causes? There is no known cause of baby blues. It is likely caused by a combination of factors. However, changes in hormone levels after childbirth are believed to trigger some of the symptoms. Other factors that can play a role in these mood changes  include:  Lack of sleep.  Stressful life events, such as poverty, caring for a loved one, or death of a loved one.  Genetics. What are the signs or symptoms? Symptoms of this condition include:  Brief changes in mood, such as going from extreme happiness to sadness.  Decreased concentration.  Difficulty sleeping.  Crying spells and tearfulness.  Loss of appetite.  Irritability.  Anxiety. If the symptoms of baby blues last for more than 2 weeks or become more severe, you may have postpartum depression. How is this diagnosed? This condition is diagnosed based  on an evaluation of your symptoms. There are no medical or lab tests that lead to a diagnosis, but there are various questionnaires that a health care provider may use to identify women with the baby blues or postpartum depression. How is this treated? Treatment is not needed for this condition. The baby blues usually go away on their own in 1-2 weeks. Social support is often all that is needed. You will be encouraged to get adequate sleep and rest. Follow these instructions at home: Lifestyle      Get as much rest as you can. Take a nap when the baby sleeps.  Exercise regularly as told by your health care provider. Some women find yoga and walking to be helpful.  Eat a balanced and nourishing diet. This includes plenty of fruits and vegetables, whole grains, and lean proteins.  Do little things that you enjoy. Have a cup of tea, take a bubble bath, read your favorite magazine, or listen to your favorite music.  Avoid alcohol.  Ask for help with household chores, cooking, grocery shopping, or running errands. Do not try to do everything yourself. Consider hiring a postpartum doula to help. This is a professional who specializes in providing support to new mothers.  Try not to make any major life changes during pregnancy or right after giving birth. This can add stress. General instructions  Talk to people close to  you about how you are feeling. Get support from your partner, family members, friends, or other new moms. You may want to join a support group.  Find ways to cope with stress. This may include: ? Writing your thoughts and feelings in a journal. ? Spending time outside. ? Spending time with people who make you laugh.  Try to stay positive in how you think. Think about the things you are grateful for.  Take over-the-counter and prescription medicines only as told by your health care provider.  Let your health care provider know if you have any concerns.  Keep all postpartum visits as told by your health care provider. This is important. Contact a health care provider if:  Your baby blues do not go away after 2 weeks. Get help right away if:  You have thoughts of taking your own life (suicidal thoughts).  You think you may harm the baby or other people.  You see or hear things that are not there (hallucinations). Summary  After giving birth, you may feel happy one minute and sad or stressed the next. Feelings of sadness that happen right after the baby is born and go away after a week or two are called the "baby blues."  You can manage the baby blues by getting enough rest, eating a healthy diet, exercising, spending time with supportive people, and finding ways to cope with stress.  If feelings of sadness and stress last longer than 2 weeks or get in the way of caring for your baby, talk to your health care provider. This may mean you have postpartum depression. This information is not intended to replace advice given to you by your health care provider. Make sure you discuss any questions you have with your health care provider. Document Released: 09/11/2004 Document Revised: 04/01/2019 Document Reviewed: 02/03/2017 Elsevier Patient Education  2020 [employer].   Breastfeeding and Self-Care Breastfeeding can be challenging, especially during the first few weeks after childbirth.  It is normal to have some problems when you start to breastfeed your new baby, even if you have breastfed before.  There are things that you can do to take care of yourself and help prevent common breastfeeding problems. Work with your health care provider or breastfeeding specialist (Advertising copywriterlactation consultant) to find strategies that work best for you. How does this affect me? Keeping your breasts healthy and ensuring that your baby attaches to your nipple well (a good latch) are important parts of having a good breastfeeding experience. Poor latching can lead to problems, such as:  Cracked or sore nipples.  Breasts becoming overfilled with milk (engorgement).  Plugged milk ducts.  Low milk supply.  Breast inflammation or infection. How does this affect my baby? By taking steps to avoid breastfeeding problems, you will help ensure that your baby can feed effectively and gain weight as he or she should. Follow these instructions at home: Breastfeeding strategy   Always make sure that your baby latches and is in a proper position. Try different breastfeeding positions to find one that works best for you and your baby.  Breastfeed when you feel the need to reduce the fullness of your breasts or when your baby shows signs of hunger. This is called "breastfeeding on demand."  Do not delay feedings.  Try to relax when it is time to feed your baby. This helps to trigger your let-down reflex, which releases milk from your breast.  To help increase milk flow: ? Pump or hand express a small amount of breast milk right before breastfeeding to soften your breast, areola, and nipple. ? Apply warm, moist heat to your breast right before feeding to increase circulation and help milk flow. You can do this in the shower or with hand towels soaked with warm water. ? Massage your breast right before or during feeding to increase circulation and help milk flow. Breast care   Ensure that your breasts stay  moisturized and healthy. This will help prevent cracking and ease soreness. To do this: ? Avoid using soap on your nipples. ? Let your nipples air-dry for 3-4 minutes after each feeding. ? Use only cotton bra pads to absorb breast milk that leaks. Be sure to change the pads if they become soaked with milk. If you use disposable bra pads, change them often. ? Use lanolin on your nipples after nursing. If you use pure lanolin, you do not need to wash it off before feeding your baby again. Pure lanolin is not poisonous (toxic) to your baby. ? Massage some breast milk into your nipples: ? Use your hand to squeeze out a few drops of breast milk (hand express). ? Gently massage the milk into your nipples. ? Let your nipples air-dry.  Wear a supportive nursing bra. Avoid wearing tight clothing, bras that put pressure on your breasts, or underwire bras.  Use cold therapy to help relieve pain or swelling of your breasts: ? Put ice in a plastic bag. ? Place a towel between your skin and the bag. ? Leave the ice on for 20 minutes, 2-3 times a day. General instructions  Drink enough fluid to keep your urine pale yellow.  Get plenty of rest. Sleep when your baby sleeps.  Talk to your health care provider or lactation consultant before taking any herbal supplements. Contact a health care provider if:  You have nipple pain.  You have cracking or soreness in your nipples that lasts longer than 1 week.  You have breast engorgement that lasts longer than 48 hours.  You have a fever.  You have pus-like discharge coming from your nipple.  You have redness, a rash, swelling, itching, or burning on your breast.  Your baby does not gain weight or loses weight. Summary  Keeping your breasts healthy and ensuring a good latch are important parts of having a good breastfeeding experience. Take steps to take care of yourself and work with your health care provider or breastfeeding specialist (Hotel manager) to find strategies that work best for you.  Always make sure that your baby is latched and positioned properly. Try different breastfeeding positions to find one that works best for you and your baby.  Keep your nipples moisturized, drink plenty of fluid, and get plenty of rest. Feed on demand, and do not delay feedings. This information is not intended to replace advice given to you by your health care provider. Make sure you discuss any questions you have with your health care provider. Document Released: 07/15/2017 Document Revised: 03/30/2019 Document Reviewed: 07/15/2017 Elsevier Patient Education  2020 [employer].

## 2019-12-04 NOTE — Progress Notes (Signed)
Patient discharged home with infant. Discharge instructions reviewed and given to pt. Pt verbalized understanding. Escorted out by staff.  

## 2019-12-04 NOTE — Lactation Note (Signed)
This note was copied from a baby's chart. Lactation Consultation Note  Patient Name: Angela Mckee YNWGN'F Date: 12/04/2019 Reason for consult: Follow-up assessment;Other (Comment)(Hx Gastric Bypass & depression-Lexapro upped from 10 to 20mg )  Mom bottlefed formula through the night.  When questioned about plans to breast feed, mom reports having Evenflo electric pump at home and will breast feed and/or pump when get home.  Explained supply and demand and normal course of lactation.  Lactation number given and encouraged to call with any questions, concerns or assistance. Maternal Data Formula Feeding for Exclusion: No Has patient been taught Hand Expression?: Yes Does the patient have breastfeeding experience prior to this delivery?: Yes  Feeding Feeding Type: Bottle Fed - Formula Nipple Type: Slow - flow  LATCH Score                   Interventions    Lactation Tools Discussed/Used WIC Program: Yes   Consult Status Consult Status: PRN Follow-up type: Call as needed    Jarold Motto 12/04/2019, 1:39 PM

## 2019-12-04 NOTE — Discharge Summary (Signed)
Obstetrical Discharge Summary  Patient Name: Angela Mckee DOB: 07/10/1991 MRN: [id number]  Date of Admission: 12/02/2019 Date of Delivery: 12/02/2019 Delivered by: Dr. Leonides Schanz Date of Discharge: 12/04/2019  Primary OB: [facility] UKG:URKYHCW'C last menstrual period was 03/03/2019. EDC Estimated Date of Delivery: 12/08/19 Gestational Age at Delivery: [redacted]w[redacted]d   Antepartum complications:  Pregnancy Issues: 1. Transfer of care late 2nd trimester 2. Obesity BMI 38 3. Hx HSV, on acyclovir 4. cHTN no meds 5. History of cesarean x2 6. Anxiety and depression, on Lexapro and Buspar 7. IUGR diagnosed 2 days ago.  10% ile. 8. H/o asthma 9. H/o gastric bypass  Admitting Diagnosis: Scheduled repeat C/S Secondary Diagnosis: Patient Active Problem List   Diagnosis Date Noted  . S/P cesarean section 12/04/2019  . Depression 12/04/2019  . Headache 09/05/2019    Augmentation: n/a Complications: None  Intrapartum complications/course:  Delivery Type: repeat cesarean section, low transverse incision Anesthesia: Spinal Placenta: spontaneous Laceration: n/a Episiotomy: n/a Newborn Data: Live born female  Birth Weight: 6 lb 0.3 oz (2730 g) APGAR: 9, 9  Newborn Delivery   Birth date/time: 12/02/2019 08:32:00 Delivery type: C-Section, Low Transverse Trial of labor: No C-section categorization: Repeat      28yo [id number] at 39+4wks presenting for repeat LTCS  Postpartum Procedures: none  Post partum course:  Patient had an uncomplicated postpartum course.  By time of discharge on POD#2, her pain was controlled on oral pain medications; she had appropriate lochia and was ambulating, voiding without difficulty, tolerating regular diet and passing flatus.   She was deemed stable for discharge to home.    Discharge Physical Exam:  BP (!) 99/51 (BP Location: Right Arm)   Pulse (!) 57   Temp 97.8 F (36.6 C) (Oral)   Resp 18   Ht 5\' 2"  (1.575 m)   Wt 93.9 kg   LMP  03/03/2019   SpO2 98%   Breastfeeding Unknown   BMI 37.86 kg/m   General: alert and no distress Pulm: normal respiratory effort Lochia: appropriate Abdomen: soft, NT Uterine Fundus: firm, below umbilicus Incision: healing well, no significant drainage, no dehiscence, no significant erythema Extremities: No evidence of DVT seen on physical exam. No lower extremity edema. Edinburgh: 14 - Lexapro dose increased to 20mg  prior to discharge  Labs: CBC Latest Ref Rng & Units 12/03/2019 11/29/2019 12/12/2018  WBC 4.0 - 10.5 K/uL 14.9(H) 9.0 8.0  Hemoglobin 12.0 - 15.0 g/dL 9.9(L) 12.7 14.7  Hematocrit 36.0 - 46.0 % 28.7(L) 36.8 44.9  Platelets 150 - 400 K/uL 149(L) 184 208   AB POS Hemoglobin  Date Value Ref Range Status  12/03/2019 9.9 (L) 12.0 - 15.0 g/dL Final   HCT  Date Value Ref Range Status  12/03/2019 28.7 (L) 36.0 - 46.0 % Final    Disposition: stable, discharge to home Baby Feeding: breastmilk Baby Disposition: home with mom  Contraception: unknown  Prenatal Labs:  Blood type/Rh AB pos  Antibody screen neg  Rubella Immune  Varicella Immune  RPR NR  HBsAg Neg  HIV NR  GC neg  Chlamydia neg  Genetic screening negative  1 hour GTT 86  3 hour GTT   GBS neg   Rh Immune globulin given: n/a Rubella vaccine given: n/a Varicella vaccine given: n/a Tdap vaccine given in AP or PP setting: AP 11/08/2019 Flu vaccine given in AP or PP setting: AP 10/17/2019  Plan: Stann Mainland was discharged to home in good condition. Follow-up appointment with delivering provider in 6 weeks.  Discharge Instructions: Per After Visit Summary. Activity: Advance as tolerated. Pelvic rest for 6 weeks.   Diet: Regular Discharge Medications: Allergies as of 12/04/2019      Reactions   Morphine And Related Hives, Shortness Of Breath, Swelling      Medication List    STOP taking these medications   acetaminophen 500 MG tablet Commonly known as: TYLENOL   fluticasone 50  MCG/ACT nasal spray Commonly known as: FLONASE     TAKE these medications   acyclovir 400 MG tablet Commonly known as: ZOVIRAX Take 400 mg by mouth 3 (three) times daily.   busPIRone 5 MG tablet Commonly known as: BUSPAR Take 5 mg by mouth every morning.   escitalopram 10 MG tablet Commonly known as: LEXAPRO Take 10 mg by mouth every morning.   multivitamin-prenatal 27-0.8 MG Tabs tablet Take 1 tablet by mouth daily at 12 noon.   vitamin B-6 25 MG tablet Commonly known as: pyridOXINE Take 25 mg by mouth daily.      Outpatient follow up:  Follow-up Information    Ward, Elenora Fender, MD. Schedule an appointment as soon as possible for a visit in 2 week(s).   Specialty: Obstetrics and Gynecology Why: for Post-op appt Contact information: [address] [place] [address] [phone]           Signed: Haroldine Laws, CNM 12/04/2019 10:25 AM

## 2019-12-05 NOTE — Anesthesia Postprocedure Evaluation (Signed)
Anesthesia Post Note  Patient: Angela Mckee  Procedure(s) Performed: REPEAT CESAREAN SECTION (N/A )  Patient location during evaluation: Mother Baby Anesthesia Type: Spinal Level of consciousness: awake and alert and oriented Pain management: pain level controlled Vital Signs Assessment: post-procedure vital signs reviewed and stable Respiratory status: spontaneous breathing, nonlabored ventilation and respiratory function stable Cardiovascular status: stable Postop Assessment: no headache, no backache and no signs of nausea or vomiting (no pruritis) Anesthetic complications: no     Last Vitals:  Vitals:   12/03/19 2358 12/04/19 0730  BP: 117/63 (!) 99/51  Pulse: 62 (!) 57  Resp: 20 18  Temp: 36.7 C 36.6 C  SpO2: 97% 98%    Last Pain:  Vitals:   12/04/19 0850  TempSrc:   PainSc: 0-No pain                 Adrianah Prophete

## 2020-01-05 ENCOUNTER — Encounter: Payer: Self-pay | Admitting: Emergency Medicine

## 2020-01-05 ENCOUNTER — Ambulatory Visit
Admission: EM | Admit: 2020-01-05 | Discharge: 2020-01-05 | Disposition: A | Discharge status: Home / Self Care | Payer: Medicaid Other | Attending: Internal Medicine | Admitting: Internal Medicine

## 2020-01-05 ENCOUNTER — Ambulatory Visit: Payer: Medicaid Other

## 2020-01-05 ENCOUNTER — Other Ambulatory Visit: Payer: Self-pay

## 2020-01-05 DIAGNOSIS — W19XXXA Unspecified fall, initial encounter: Secondary | ICD-10-CM | POA: Diagnosis not present

## 2020-01-05 DIAGNOSIS — M25511 Pain in right shoulder: Secondary | ICD-10-CM

## 2020-01-05 DIAGNOSIS — Z79899 Other long term (current) drug therapy: Secondary | ICD-10-CM | POA: Insufficient documentation

## 2020-01-05 DIAGNOSIS — Z87891 Personal history of nicotine dependence: Secondary | ICD-10-CM | POA: Insufficient documentation

## 2020-01-05 DIAGNOSIS — W08XXXA Fall from other furniture, initial encounter: Secondary | ICD-10-CM | POA: Insufficient documentation

## 2020-01-05 DIAGNOSIS — J45909 Unspecified asthma, uncomplicated: Secondary | ICD-10-CM | POA: Diagnosis not present

## 2020-01-05 DIAGNOSIS — Z86718 Personal history of other venous thrombosis and embolism: Secondary | ICD-10-CM | POA: Insufficient documentation

## 2020-01-05 DIAGNOSIS — S29012A Strain of muscle and tendon of back wall of thorax, initial encounter: Secondary | ICD-10-CM | POA: Insufficient documentation

## 2020-01-05 DIAGNOSIS — Z885 Allergy status to narcotic agent status: Secondary | ICD-10-CM | POA: Insufficient documentation

## 2020-01-05 MED ORDER — CYCLOBENZAPRINE HCL 5 MG PO TABS: ORAL_TABLET | ORAL | 0 refills | Status: DC

## 2020-01-05 NOTE — Discharge Instructions (Addendum)
Use ice today for 15 minutes every 2 hours today, then starting tomorrow Use heat on area of pain for 15 minutes 2-4 times a day and do stretches I taught you for 5-7 days.  Follow up with you family Dr. In 7-10 days.
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
## 2020-01-06 ENCOUNTER — Ambulatory Visit: Payer: Medicaid Other

## 2020-01-06 ENCOUNTER — Ambulatory Visit
Admission: EM | Admit: 2020-01-06 | Discharge: 2020-01-06 | Disposition: A | Discharge status: Home / Self Care | Payer: Medicaid Other | Attending: Family Medicine | Admitting: Family Medicine

## 2020-01-06 ENCOUNTER — Other Ambulatory Visit: Payer: Self-pay

## 2020-01-06 DIAGNOSIS — M25511 Pain in right shoulder: Secondary | ICD-10-CM | POA: Diagnosis present

## 2020-01-06 DIAGNOSIS — R0789 Other chest pain: Secondary | ICD-10-CM | POA: Diagnosis not present

## 2020-01-06 MED ORDER — OXYCODONE-ACETAMINOPHEN 5-325 MG PO TABS: ORAL_TABLET | ORAL | 0 refills | Status: DC

## 2020-01-06 NOTE — ED Provider Notes (Signed)
[facility]    CSN: [id number] Arrival date & time: 01/06/20  1606      History   Chief Complaint Chief Complaint  Patient presents with  . Shoulder Pain    right    HPI Angela Mckee is a 29 y.o. female.   29 yo female with a c/o right upper chest pain, right upper back and right shoulder pain. States she was seen here yesterday and received trigger point injections. Patient states that pain is worse. Complains of severe pain, not relieved with tylenol/ibuprofen or muscle relaxer.  Denies any fevers, chills, skin discoloration. Also states that earlier in the week she had a fall at home during which she landed on her right shoulder.    Shoulder Pain   Past Medical History:  Diagnosis Date  . Asthma    WELL CONTROLLED  . Bradycardia    PT STATES THAT AT DR Lacretia Nicks [facility] OFFICE Dec 25, 2017 THAT HER PULSE WAS 49-PT STATES SHE DOES HAVE OCC DIZZINESS.  HER LAST PULSE CHECKED IN DR WARDS OFFICE ON 01-04-18 WAS 80.  PT ASYMPTOMATIC OTHERWISE  . Complication of anesthesia    HARD TO WAKE UP  . DVT (deep venous thrombosis) (HCC) 2018   AFTER WEIGHT LOSS SURGERY  . GERD (gastroesophageal reflux disease)    NO MEDS  . Headache    MIGRAINES  . Hemorrhoids    PT HAVING HEMORRHOID BANDING IN OFFICE ON 01-07-18  . History of kidney stones    H/O  . Panic attack   . PONV (postoperative nausea and vomiting)     Patient Active Problem List   Diagnosis Date Noted  . S/P cesarean section 12/04/2019  . Depression 12/04/2019  . Headache 09/05/2019    Past Surgical History:  Procedure Laterality Date  . ADENOIDECTOMY    . CESAREAN SECTION     X2  . CESAREAN SECTION N/A 12/02/2019   Procedure: REPEAT CESAREAN SECTION;  Surgeon: Ward, Elenora Fender, MD;  Location: [facility] ORS;  Service: Obstetrics;  Laterality: N/A;  . CHOLECYSTECTOMY    . HYSTEROSCOPY WITH D & C N/A 01/11/2018   Procedure: DILATATION AND CURETTAGE /HYSTEROSCOPY;  Surgeon: Ward, Elenora Fender, MD;  Location:  [facility] ORS;  Service: Gynecology;  Laterality: N/A;  . 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 RECTAL EXAM UNDER ANESTHESIA N/A 01/11/2018   Procedure: RECTAL EXAM UNDER ANESTHESIA;  Surgeon: Ward, Elenora Fender, MD;  Location: [facility] ORS;  Service: Gynecology;  Laterality: N/A;  . SPHINCTEROTOMY  01/11/2018   Procedure: SPHINCTEROTOMY;  Surgeon: Ward, Elenora Fender, MD;  Location: [facility] ORS;  Service: Gynecology;;  . TONSILLECTOMY      OB History    Gravida  3   Para  3   Term  3   Preterm      AB      Living  3     SAB      TAB      Ectopic      Multiple  0   Live Births  3            Home Medications    Prior to Admission medications   Medication Sig Start Date End Date Taking? Authorizing Provider  acyclovir (ZOVIRAX) 400 MG tablet Take 400 mg by mouth 3 (three) times daily. 11/03/19  Yes [provider]  busPIRone (BUSPAR) 5 MG tablet Take 5 mg by  mouth every morning.    Yes [provider]  cyclobenzaprine (FLEXERIL) 5 MG tablet 1-2 tid prn muscle spasm and pain 01/05/20  Yes Rodriguez-Southworth, Nettie Elm, PA-C  escitalopram (LEXAPRO) 10 MG tablet Take 10 mg by mouth every morning.    Yes [provider]  Prenatal Vit-Fe Fumarate-FA (MULTIVITAMIN-PRENATAL) 27-0.8 MG TABS tablet Take 1 tablet by mouth daily at 12 noon.   Yes [provider]  oxyCODONE-acetaminophen (PERCOCET/ROXICET) 5-325 MG tablet 1-2 tabs po bid prn 01/06/20   [name], MD    Family History Family History  Problem Relation Age of Onset  . Skin cancer Mother   . Depression Mother   . Anxiety disorder Mother   . Hypertension Father   . Hyperlipidemia Father     Social History Social History   Tobacco Use  . Smoking status: Former Smoker    Packs/day: 0.25    Years: 8.00    Pack years: 2.00    Types: Cigarettes    Quit date: 08/05/2017    Years since quitting: 2.4  .  Smokeless tobacco: Never Used  Substance Use Topics  . Alcohol use: Yes    Comment: occasional  . Drug use: No     Allergies   Morphine and related   Review of Systems Review of Systems   Physical Exam Triage Vital Signs ED Triage Vitals  Enc Vitals Group     BP 01/06/20 1623 117/69     Pulse Rate 01/06/20 1623 60     Resp 01/06/20 1623 18     Temp 01/06/20 1623 98.6 F (37 C)     Temp Source 01/06/20 1623 Oral     SpO2 01/06/20 1623 100 %     Weight 01/06/20 1621 189 lb 9.5 oz (86 kg)     Height 01/06/20 1621 5\' 2"  (1.575 m)     Head Circumference --      Peak Flow --      Pain Score 01/06/20 1621 10     Pain Loc --      Pain Edu? --      Excl. in [place]? --    No data found.  Updated Vital Signs BP 117/69 (BP Location: Left Arm)   Pulse 60   Temp 98.6 F (37 C) (Oral)   Resp 18   Ht 5\' 2"  (1.575 m)   Wt 86 kg   SpO2 100%   Breastfeeding No   BMI 34.68 kg/m   Visual Acuity Right Eye Distance:   Left Eye Distance:   Bilateral Distance:    Right Eye Near:   Left Eye Near:    Bilateral Near:     Physical Exam Vitals and nursing note reviewed.  Constitutional:      General: She is not in acute distress.    Appearance: She is not toxic-appearing or diaphoretic.  Cardiovascular:     Pulses: Normal pulses.  Chest:     Chest wall: Tenderness (right upper chest wall) present.  Musculoskeletal:     Left shoulder: Tenderness present. No swelling, deformity, effusion, laceration or crepitus. Decreased range of motion. Normal pulse.     Comments: No skin erythema or discoloration; right upper extremity neurovascularly intact  Neurological:     Mental Status: She is alert.      UC Treatments / Results  Labs (all labs ordered are listed, but only abnormal results are displayed) Labs Reviewed - No data to display  EKG   Radiology DG Chest 2 View  Result Date: 01/06/2020 CLINICAL DATA:  29 year old female with right shoulder pain. EXAM: CHEST - 2  VIEW; RIGHT SHOULDER - 2+ VIEW COMPARISON:  Chest radiograph dated 01/05/2020. FINDINGS: The lungs are clear. There is no pleural effusion or pneumothorax. The cardiac silhouette is within normal limits. There is no acute fracture or dislocation of the right shoulder. The bones are well mineralized. No arthritic changes. The soft tissues are unremarkable. IMPRESSION: 1. No acute cardiopulmonary process. 2. No acute fracture or dislocation of the right shoulder. Electronically Signed   By: Anner Crete M.D.   On: 01/06/2020 17:06   DG Chest 2 View  Result Date: 01/05/2020 CLINICAL DATA:  Pain following recent fall EXAM: CHEST - 2 VIEW COMPARISON:  None. FINDINGS: Lungs are clear. Heart size and pulmonary vascularity are normal. No adenopathy. No pneumothorax. No bone lesions evident. IMPRESSION: No abnormality noted. Electronically Signed   By: Lowella Grip III M.D.   On: 01/05/2020 11:53   DG Shoulder Right  Result Date: 01/06/2020 CLINICAL DATA:  29 year old female with right shoulder pain. EXAM: CHEST - 2 VIEW; RIGHT SHOULDER - 2+ VIEW COMPARISON:  Chest radiograph dated 01/05/2020. FINDINGS: The lungs are clear. There is no pleural effusion or pneumothorax. The cardiac silhouette is within normal limits. There is no acute fracture or dislocation of the right shoulder. The bones are well mineralized. No arthritic changes. The soft tissues are unremarkable. IMPRESSION: 1. No acute cardiopulmonary process. 2. No acute fracture or dislocation of the right shoulder. Electronically Signed   By: Anner Crete M.D.   On: 01/06/2020 17:06    Procedures Procedures (including critical care time)  Medications Ordered in UC Medications - No data to display  Initial Impression / Assessment and Plan / UC Course  I have reviewed the triage vital signs and the nursing notes.  Pertinent labs & imaging results that were available during my care of the patient were reviewed by me and considered in my  medical decision making (see chart for details).      Final Clinical Impressions(s) / UC Diagnoses   Final diagnoses:  Acute pain of right shoulder     Discharge Instructions     Warm compresses to area Over the counter analgesics    ED Prescriptions    Medication Sig Dispense Auth. Provider   oxyCODONE-acetaminophen (PERCOCET/ROXICET) 5-325 MG tablet 1-2 tabs po bid prn 10 tablet Yvanna Vidas, Linward Foster, MD     1. x-ray results and diagnosis reviewed with patient 2. rx as per orders above; reviewed possible side effects, interactions, risks and benefits  3. Recommend supportive treatment as above  4. Follow-up prn if symptoms worsen or don't improve   I have reviewed the PDMP during this encounter.   Norval Gable, MD 01/06/20 [phone]

## 2020-01-06 NOTE — Discharge Instructions (Signed)
Warm compresses to area Over the counter analgesics

## 2020-01-06 NOTE — ED Triage Notes (Signed)
Patient states that she was seen yesterday by Nettie Elm and given trigger point injections. Patient states that the pain is worsening. Reports that she has swelling in her chest and back. Patient states that even talking too loud causes pain.

## 2020-02-02 ENCOUNTER — Other Ambulatory Visit: Payer: Self-pay

## 2020-02-02 ENCOUNTER — Ambulatory Visit (LOCAL_COMMUNITY_HEALTH_CENTER): Payer: Medicaid Other | Admitting: Family Medicine

## 2020-02-02 VITALS — BP 117/69 | Ht 62.0 in | Wt 196.4 lb

## 2020-02-02 DIAGNOSIS — Z3009 Encounter for other general counseling and advice on contraception: Secondary | ICD-10-CM | POA: Diagnosis not present

## 2020-02-02 DIAGNOSIS — Z30017 Encounter for initial prescription of implantable subdermal contraceptive: Secondary | ICD-10-CM | POA: Diagnosis not present

## 2020-02-02 DIAGNOSIS — Z113 Encounter for screening for infections with a predominantly sexual mode of transmission: Secondary | ICD-10-CM

## 2020-02-02 LAB — PREGNANCY, URINE: Preg Test, Ur: NEGATIVE

## 2020-02-02 LAB — WET PREP FOR TRICH, YEAST, CLUE: Trichomonas Exam: NEGATIVE

## 2020-02-02 MED ORDER — ETONOGESTREL 68 MG ~~LOC~~ IMPL
68.0000 mg | DRUG_IMPLANT | Freq: Once | SUBCUTANEOUS | Status: AC
  Administered 2020-02-02: 68 mg via SUBCUTANEOUS

## 2020-02-02 NOTE — Progress Notes (Signed)
Patient here today for Nexplanon, at 8 weeks PP. Also want STD testing. Last sex about 2 weeks ago with condom.Burt Knack, RN

## 2020-02-02 NOTE — Progress Notes (Signed)
Contraception/Family Planning VISIT ENCOUNTER NOTE  Subjective:   Angela Mckee is a 29 y.o. G10P3003 female here for reproductive life counseling.  Desires long term nature from [facility] and has used nexplanon x2 prior  Reports she does not want a pregnancy in the next year. Denies abnormal vaginal bleeding, discharge, pelvic pain, problems with intercourse or other gynecologic concerns.    Last sex was 2+ weeks ago with condom. Urine pregnancy today is negative  Gynecologic History No LMP recorded (lmp unknown). Contraception: none  Health Maintenance Due  Topic Date Due  . PAP SMEAR-Modifier  09/13/2010  . INFLUENZA VACCINE  07/23/2019     The following portions of the patient's history were reviewed and updated as appropriate: allergies, current medications, past family history, past medical history, past social history, past surgical history and problem list.  Review of Systems Pertinent items are noted in HPI.   Objective:  BP 117/69   Ht 5\' 2"  (1.575 m)   Wt 196 lb 6.4 oz (89.1 kg)   LMP  (LMP Unknown)   Breastfeeding No Comment: stopped about 2 1/2 weeks ago  BMI 35.92 kg/m  Gen: well appearing, NAD HEENT: no scleral icterus CV: RR Lung: Normal WOB Ext: warm well perfused  Nexplanon Insertion Procedure Patient identified, informed consent performed, consent signed.   Patient does understand that irregular bleeding is a very common side effect of this medication. She was advised to have backup contraception after placement. Patient was determined to meet WHO criteria for not being pregnant. Appropriate time out taken.  The insertion site was identified 8-10 cm (3-4 inches) from the medial epicondyle of the humerus and 3-5 cm (1.25-2 inches) posterior to (below) the sulcus (groove) between the biceps and triceps muscles of the patient's left arm and marked.  Patient was prepped with alcohol swab and then injected with 3 ml of 1% lidocaine.  Arm was prepped with  chlorhexidene, Nexplanon removed from packaging,  Device confirmed in needle, then inserted full length of needle and withdrawn per handbook instructions. Nexplanon was able to palpated in the patient's arm; patient palpated the insert herself. There was minimal blood loss.  Patient insertion site covered with guaze and a pressure bandage to reduce any bruising.  The patient tolerated the procedure well and was given post procedure instructions.     Assessment and Plan:   Contraception counseling: Reviewed all forms of birth control options in the tiered based approach. available including abstinence; over the counter/barrier methods; hormonal contraceptive medication including pill, patch, ring, injection,contraceptive implant; hormonal and nonhormonal IUDs; permanent sterilization options including vasectomy and the various tubal sterilization modalities. Risks, benefits, and typical effectiveness rates were reviewed.  Questions were answered.  Written information was also given to the patient to review.  Patient desires Nexplanon, this was prescribed for patient. She will follow up in  19yr for surveillance.  She was told to call with any further questions, or with any concerns about this method of contraception.  Emphasized use of condoms 100% of the time for STI prevention.   1. Screening examination for venereal disease Treat wet mount per standing (no vaginal discharge) - WET PREP FOR TRICH, YEAST, CLUE - Chlamydia/Gonorrhea [facility] - HIV [facility] - Syphilis Serology, [facility] - Pregnancy, urine  2. Family planning Desires nexplanon Discussed importance of postpartum follow up exam at [facility]. She voiced understanding  3. Nexplanon insertion - etonogestrel (NEXPLANON) implant 68 mg   Please refer  to After Visit Summary for other counseling recommendations.   Return in about 26 days (around 02/28/2020) for Scheduled PP exam at [facility].  Caren Macadam, MD [place]

## 2020-02-02 NOTE — Progress Notes (Signed)
Wet mount reviewed, no treatment indicated. PT negative. Patient states she had a 2 week PP visit with primary provider and has PP visit on 02/28/2020 as well with primary provider.Burt Knack, RN

## 2020-08-07 ENCOUNTER — Ambulatory Visit
Admission: EM | Admit: 2020-08-07 | Discharge: 2020-08-07 | Disposition: A | Discharge status: Home / Self Care | Payer: Medicaid Other | Attending: Family Medicine | Admitting: Family Medicine

## 2020-08-07 ENCOUNTER — Other Ambulatory Visit: Payer: Self-pay

## 2020-08-07 ENCOUNTER — Encounter: Payer: Self-pay | Admitting: Emergency Medicine

## 2020-08-07 DIAGNOSIS — F329 Major depressive disorder, single episode, unspecified: Secondary | ICD-10-CM | POA: Insufficient documentation

## 2020-08-07 DIAGNOSIS — R519 Headache, unspecified: Secondary | ICD-10-CM | POA: Diagnosis present

## 2020-08-07 DIAGNOSIS — B349 Viral infection, unspecified: Secondary | ICD-10-CM | POA: Insufficient documentation

## 2020-08-07 DIAGNOSIS — Z87891 Personal history of nicotine dependence: Secondary | ICD-10-CM | POA: Insufficient documentation

## 2020-08-07 DIAGNOSIS — R5383 Other fatigue: Secondary | ICD-10-CM | POA: Insufficient documentation

## 2020-08-07 DIAGNOSIS — J029 Acute pharyngitis, unspecified: Secondary | ICD-10-CM | POA: Diagnosis not present

## 2020-08-07 DIAGNOSIS — Z86718 Personal history of other venous thrombosis and embolism: Secondary | ICD-10-CM | POA: Insufficient documentation

## 2020-08-07 DIAGNOSIS — Z20822 Contact with and (suspected) exposure to covid-19: Secondary | ICD-10-CM | POA: Diagnosis not present

## 2020-08-07 DIAGNOSIS — Z79899 Other long term (current) drug therapy: Secondary | ICD-10-CM | POA: Diagnosis not present

## 2020-08-07 LAB — SARS CORONAVIRUS 2 (TAT 6-24 HRS): SARS Coronavirus 2: NEGATIVE

## 2020-08-07 LAB — GROUP A STREP BY PCR: Group A Strep by PCR: NOT DETECTED

## 2020-08-07 MED ORDER — KETOROLAC TROMETHAMINE 10 MG PO TABS: 10.0000 mg | ORAL_TABLET | Freq: Four times a day (QID) | ORAL | 0 refills | Status: DC | PRN

## 2020-08-07 NOTE — ED Triage Notes (Signed)
Patient states she was exposed to strep throat yesterday. She is c/o sore throat, fatigue and headache that started yesterday.

## 2020-08-07 NOTE — Discharge Instructions (Signed)
Medication as directed.  COVID test will return tomorrow.  Take care  Dr. Adriana Simas

## 2020-08-07 NOTE — ED Provider Notes (Signed)
[facility]    CSN: [id number] Arrival date & time: 08/07/20  1125      History   Chief Complaint Chief Complaint  Patient presents with  . Sore Throat  . Headache   HPI  29 year old female presents with fatigue, sore throat, headache.  Symptoms started yesterday.  She states that her daughter has RSV.  She states that a coworker has had strep throat.  She reports sore throat, fatigue, headache.  No other respiratory symptoms.  No documented fever.  No relieving factors.  States she feels very poorly.  She rates her pain as 9/10 in severity.  No other complaints.  Past Medical History:  Diagnosis Date  . Asthma    WELL CONTROLLED  . Bradycardia    PT STATES THAT AT DR Lacretia Nicks [facility] OFFICE Dec 25, 2017 THAT HER PULSE WAS 49-PT STATES SHE DOES HAVE OCC DIZZINESS.  HER LAST PULSE CHECKED IN DR WARDS OFFICE ON 01-04-18 WAS 80.  PT ASYMPTOMATIC OTHERWISE  . Complication of anesthesia    HARD TO WAKE UP  . DVT (deep venous thrombosis) (HCC) 2018   AFTER WEIGHT LOSS SURGERY  . GERD (gastroesophageal reflux disease)    NO MEDS  . Headache    MIGRAINES  . Hemorrhoids    PT HAVING HEMORRHOID BANDING IN OFFICE ON 01-07-18  . History of kidney stones    H/O  . Panic attack   . PONV (postoperative nausea and vomiting)     Patient Active Problem List   Diagnosis Date Noted  . S/P cesarean section 12/04/2019  . Depression 12/04/2019  . Headache 09/05/2019    Past Surgical History:  Procedure Laterality Date  . ADENOIDECTOMY    . CESAREAN SECTION     X2  . CESAREAN SECTION N/A 12/02/2019   Procedure: REPEAT CESAREAN SECTION;  Surgeon: Ward, Elenora Fender, MD;  Location: [facility] ORS;  Service: Obstetrics;  Laterality: N/A;  . CHOLECYSTECTOMY    . HYSTEROSCOPY WITH D & C N/A 01/11/2018   Procedure: DILATATION AND CURETTAGE /HYSTEROSCOPY;  Surgeon: Ward, Elenora Fender, MD;  Location: [facility] ORS;  Service: Gynecology;  Laterality: N/A;  . LAPAROSCOPIC GASTRIC RESTRICTIVE DUODENAL  PROCEDURE (DUODENAL SWITCH)     AND HAD HER GB REMOVED AT THE SAME TIME  . RECTAL EXAM UNDER ANESTHESIA N/A 01/11/2018   Procedure: RECTAL EXAM UNDER ANESTHESIA;  Surgeon: Ward, Elenora Fender, MD;  Location: [facility] ORS;  Service: Gynecology;  Laterality: N/A;  . SPHINCTEROTOMY  01/11/2018   Procedure: SPHINCTEROTOMY;  Surgeon: Ward, Elenora Fender, MD;  Location: [facility] ORS;  Service: Gynecology;;  . TONSILLECTOMY      OB History    Gravida  3   Para  3   Term  3   Preterm      AB      Living  3     SAB      TAB      Ectopic      Multiple  0   Live Births  3            Home Medications    Prior to Admission medications   Medication Sig Start Date End Date Taking? Authorizing Provider  acyclovir (ZOVIRAX) 400 MG tablet Take 400 mg by mouth 3 (three) times daily. 11/03/19  Yes [provider]  busPIRone (BUSPAR) 5 MG tablet Take 5 mg by mouth every morning.    Yes [provider]  clonazePAM (KLONOPIN) 0.5 MG tablet Take 0.5 mg  by mouth 2 (two) times daily as needed. 05/10/20  Yes [provider]  escitalopram (LEXAPRO) 10 MG tablet Take 10 mg by mouth every morning.    Yes [provider]  ketorolac (TORADOL) 10 MG tablet Take 1 tablet (10 mg total) by mouth every 6 (six) hours as needed for moderate pain or severe pain. 08/07/20   Tommie Sams, DO    Family History Family History  Problem Relation Age of Onset  . Skin cancer Mother   . Depression Mother   . Anxiety disorder Mother   . Hypertension Father   . Hyperlipidemia Father     Social History Social History   Tobacco Use  . Smoking status: Former Smoker    Packs/day: 0.25    Years: 8.00    Pack years: 2.00    Types: Cigarettes    Quit date: 08/05/2017    Years since quitting: 3.0  . Smokeless tobacco: Never Used  Vaping Use  . Vaping Use: Some days  . Substances: Nicotine  Substance Use Topics  . Alcohol use: Yes    Comment: occasional  . Drug use: No      Allergies   Morphine and related   Review of Systems Review of Systems  Constitutional: Positive for fatigue.  HENT: Positive for sore throat.   Neurological: Positive for headaches.   Physical Exam Triage Vital Signs ED Triage Vitals  Enc Vitals Group     BP 08/07/20 1138 120/72     Pulse Rate 08/07/20 1138 79     Resp 08/07/20 1138 18     Temp --      Temp src --      SpO2 08/07/20 1138 97 %     Weight 08/07/20 1134 195 lb (88.5 kg)     Height 08/07/20 1134 5\' 2"  (1.575 m)     Head Circumference --      Peak Flow --      Pain Score 08/07/20 1134 9     Pain Loc --      Pain Edu? --      Excl. in GC? --    Updated Vital Signs BP 120/72 (BP Location: Right Arm)   Pulse 79   Resp 18   Ht 5\' 2"  (1.575 m)   Wt 88.5 kg   SpO2 97%   BMI 35.67 kg/m   Visual Acuity Right Eye Distance:   Left Eye Distance:   Bilateral Distance:    Right Eye Near:   Left Eye Near:    Bilateral Near:     Physical Exam Constitutional:      General: She is not in acute distress.    Appearance: She is obese.  HENT:     Head: Normocephalic and atraumatic.     Right Ear: Tympanic membrane normal.     Left Ear: Tympanic membrane normal.     Mouth/Throat:     Pharynx: Oropharynx is clear. No oropharyngeal exudate or posterior oropharyngeal erythema.  Cardiovascular:     Rate and Rhythm: Normal rate and regular rhythm.  Pulmonary:     Effort: Pulmonary effort is normal.     Breath sounds: Normal breath sounds. No wheezing, rhonchi or rales.  Neurological:     Mental Status: She is alert.    UC Treatments / Results  Labs (all labs ordered are listed, but only abnormal results are displayed) Labs Reviewed  GROUP A STREP BY PCR  SARS CORONAVIRUS 2 (TAT 6-24 HRS)  EKG   Radiology No results found.  Procedures Procedures (including critical care time)  Medications Ordered in UC Medications - No data to display  Initial Impression / Assessment and Plan / UC  Course  I have reviewed the triage vital signs and the nursing notes.  Pertinent labs & imaging results that were available during my care of the patient were reviewed by me and considered in my medical decision making (see chart for details).    29 year old female presents with viral illness.  Strep negative.  Awaiting Covid test results.  Toradol as needed for pain.  Supportive care.  Final Clinical Impressions(s) / UC Diagnoses   Final diagnoses:  Viral illness     Discharge Instructions     Medication as directed.  COVID test will return tomorrow.  Take care  Dr. Adriana Simas    ED Prescriptions    Medication Sig Dispense Auth. Provider   ketorolac (TORADOL) 10 MG tablet Take 1 tablet (10 mg total) by mouth every 6 (six) hours as needed for moderate pain or severe pain. 20 tablet Tommie Sams, DO     PDMP not reviewed this encounter.   Tommie Sams, [place] 08/07/20 1238

## 2020-09-04 ENCOUNTER — Ambulatory Visit: Payer: Self-pay

## 2021-03-09 ENCOUNTER — Other Ambulatory Visit: Payer: Self-pay

## 2021-03-09 ENCOUNTER — Ambulatory Visit
Admission: RE | Admit: 2021-03-09 | Discharge: 2021-03-09 | Disposition: A | Discharge status: Home / Self Care | Payer: Medicaid Other | Source: Ambulatory Visit | Attending: Emergency Medicine | Admitting: Emergency Medicine

## 2021-03-09 VITALS — BP 101/62 | HR 62 | Temp 98.8°F | Resp 18 | Ht 62.0 in | Wt 200.0 lb

## 2021-03-09 DIAGNOSIS — J029 Acute pharyngitis, unspecified: Secondary | ICD-10-CM

## 2021-03-09 LAB — GROUP A STREP BY PCR: Group A Strep by PCR: NOT DETECTED

## 2021-03-09 LAB — MONONUCLEOSIS SCREEN: Mono Screen: NEGATIVE

## 2021-03-09 MED ORDER — IBUPROFEN 800 MG PO TABS: 800.0000 mg | ORAL_TABLET | Freq: Three times a day (TID) | ORAL | 0 refills | Status: DC

## 2021-03-09 NOTE — Discharge Instructions (Signed)
You were seen for sore throat and are being treated for the same.   You are prescribed high-dose NSAIDs for sore throat.  Take up to 3 times a day as needed with food.  Use warm salt water gargles to treat your pain as well.  There is a strong chance your sore throat is due to your postnasal drip and allergies.  Use an over-the-counter allergy medication such as cetirizine (Zyrtec) to treat for postnasal drip and sore throat.  Your strep test and mononucleosis screen are still pending.  If anything results as positive, you will receive a call from an urgent care staff member.  Take care, Dr. Sharlet Salina, NP-c

## 2021-03-09 NOTE — ED Triage Notes (Addendum)
Patient in today c/o headache and sore throat x 3-4 days. Patient states her tongue and throat "feel funny". Patient denies fever. Patient has taken OTC sinus medicine and Tylenol. Patient has not had the covid vaccines. Patient had covid the last week of 11/2020.

## 2021-03-09 NOTE — ED Provider Notes (Signed)
[facility] - [place], [place]   Name: Angela Mckee DOB: [birth year] MRN: [id number] CSN: [id number] PCP: [facility]  Arrival date and time:  03/09/21 1004  Chief Complaint:  Appointment, Sore Throat, and Headache   NOTE: Prior to seeing the patient today, I have reviewed the triage nursing documentation and vital signs. Clinical staff has updated patient's PMH/PSHx, current medication list, and drug allergies/intolerances to ensure comprehensive history available to assist in medical decision making.   History:   HPI: Angela Mckee is a 30 y.o. female who presents today with complaints of sore throat and "tongue pain".  She says the symptoms started approximately 4 days ago.  She is attended to treat the symptoms with over-the-counter Tylenol but has noticed little to no relief.  She states yesterday all history of recurrent strep though she has had undergone a tonsillectomy.  She denies fevers, but she has body aches and headaches.   Past Medical History:  Diagnosis Date  . Asthma    WELL CONTROLLED  . Bradycardia    PT STATES THAT AT DR Lacretia Nicks [facility] OFFICE Dec 25, 2017 THAT HER PULSE WAS 49-PT STATES SHE DOES HAVE OCC DIZZINESS.  HER LAST PULSE CHECKED IN DR WARDS OFFICE ON 01-04-18 WAS 80.  PT ASYMPTOMATIC OTHERWISE  . Complication of anesthesia    HARD TO WAKE UP  . DVT (deep venous thrombosis) (HCC) 2018   AFTER WEIGHT LOSS SURGERY  . GERD (gastroesophageal reflux disease)    NO MEDS  . Headache    MIGRAINES  . Hemorrhoids    PT HAVING HEMORRHOID BANDING IN OFFICE ON 01-07-18  . History of kidney stones    H/O  . Panic attack   . PONV (postoperative nausea and vomiting)     Past Surgical History:  Procedure Laterality Date  . ADENOIDECTOMY    . CESAREAN SECTION     X2  . CESAREAN SECTION N/A 12/02/2019   Procedure: REPEAT CESAREAN SECTION;  Surgeon: Ward, Elenora Fender, MD;  Location: [facility] ORS;  Service: Obstetrics;  Laterality: N/A;  .  CHOLECYSTECTOMY    . HYSTEROSCOPY WITH D & C N/A 01/11/2018   Procedure: DILATATION AND CURETTAGE /HYSTEROSCOPY;  Surgeon: Ward, Elenora Fender, MD;  Location: [facility] ORS;  Service: Gynecology;  Laterality: N/A;  . 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 RECTAL EXAM UNDER ANESTHESIA N/A 01/11/2018   Procedure: RECTAL EXAM UNDER ANESTHESIA;  Surgeon: Ward, Elenora Fender, MD;  Location: [facility] ORS;  Service: Gynecology;  Laterality: N/A;  . SPHINCTEROTOMY  01/11/2018   Procedure: SPHINCTEROTOMY;  Surgeon: Ward, Elenora Fender, MD;  Location: [facility] ORS;  Service: Gynecology;;  . TONSILLECTOMY      Family History  Problem Relation Age of Onset  . Skin cancer Mother   . Depression Mother   . Anxiety disorder Mother   . Hypertension Father   . Hyperlipidemia Father     Social History   Tobacco Use  . Smoking status: Former Smoker    Packs/day: 0.25    Years: 8.00    Pack years: 2.00    Types: Cigarettes    Quit date: 08/05/2017    Years since quitting: 3.5  . Smokeless tobacco: Never Used  Vaping Use  . Vaping Use: Former  . Quit date: 01/09/2021  . Substances: Nicotine  Substance Use Topics  . Alcohol use: Yes    Comment: occasional  .  Drug use: No    Patient Active Problem List   Diagnosis Date Noted  . S/P cesarean section 12/04/2019  . Depression 12/04/2019  . Headache 09/05/2019    Home Medications:    Current Meds  Medication Sig  . acyclovir (ZOVIRAX) 400 MG tablet Take 400 mg by mouth 3 (three) times daily.  Marland Kitchen ketorolac (TORADOL) 10 MG tablet Take 1 tablet (10 mg total) by mouth every 6 (six) hours as needed for moderate pain or severe pain.    Allergies:   Morphine and related  Review of Systems (ROS): Review of Systems  Constitutional: Positive for fatigue. Negative for fever.  HENT: Positive for postnasal drip and sore throat. Negative for ear pain, rhinorrhea and sinus pain.   Respiratory:  Negative for cough and shortness of breath.   Gastrointestinal: Negative for diarrhea and vomiting.  Skin: Negative for rash.     Vital Signs: Today's Vitals   03/09/21 1015 03/09/21 1016  BP: 101/62   Pulse: 62   Resp: 18   Temp: 98.8 F (37.1 C)   TempSrc: Oral   SpO2: 100%   Weight:  200 lb (90.7 kg)  Height:  5\' 2"  (1.575 m)  PainSc: 8      Physical Exam: Physical Exam Vitals and nursing note reviewed.  Constitutional:      Appearance: Normal appearance.  HENT:     Right Ear: Tympanic membrane normal.     Left Ear: Tympanic membrane normal.     Mouth/Throat:     Mouth: Mucous membranes are moist.     Pharynx: No oropharyngeal exudate or posterior oropharyngeal erythema.  Cardiovascular:     Rate and Rhythm: Normal rate and regular rhythm.     Pulses: Normal pulses.  Pulmonary:     Effort: Pulmonary effort is normal.     Breath sounds: Normal breath sounds.  Skin:    General: Skin is warm and dry.  Neurological:     Mental Status: She is alert.      Urgent Care Treatments / Results:   LABS: PLEASE NOTE: all labs that were ordered this encounter are listed, however only abnormal results are displayed. Labs Reviewed  GROUP A STREP BY PCR  MONONUCLEOSIS SCREEN    EKG: -None  RADIOLOGY: No results found.  PROCEDURES: Procedures  MEDICATIONS RECEIVED THIS VISIT: Medications - No data to display  PERTINENT CLINICAL COURSE NOTES/UPDATES:   Initial Impression / Assessment and Plan / Urgent Care Course:  Pertinent labs & imaging results that were available during my care of the patient were personally reviewed by me and considered in my medical decision making (see lab/imaging section of note for values and interpretations).  Angela Mckee is a 30 y.o. female who presents to [facility] today with complaints of sore throat, diagnosed with acute pharyngitis, and treated as such with the medications below. NP and patient reviewed discharge  instructions below during visit.   Patient is well appearing overall in clinic today. She does not appear to be in any acute distress. Presenting symptoms (see HPI) and exam as documented above.   I have reviewed the follow up and strict return precautions for any new or worsening symptoms. Patient is aware of symptoms that would be deemed urgent/emergent, and would thus require further evaluation either here or in the emergency department. At the time of discharge, she verbalized understanding and consent with the discharge plan as it was reviewed with her. All questions were fielded by provider and/or  clinic staff prior to patient discharge.    Final Clinical Impressions / Urgent Care Diagnoses:   Final diagnoses:  Acute pharyngitis, unspecified etiology    New Prescriptions:  [place] Controlled Substance Registry consulted? Not Applicable  Meds ordered this encounter  Medications  . ibuprofen (ADVIL) 800 MG tablet    Sig: Take 1 tablet (800 mg total) by mouth 3 (three) times daily.    Dispense:  21 tablet    Refill:  0      Discharge Instructions     You were seen for sore throat and are being treated for the same.   You are prescribed high-dose NSAIDs for sore throat.  Take up to 3 times a day as needed with food.  Use warm salt water gargles to treat your pain as well.  There is a strong chance your sore throat is due to your postnasal drip and allergies.  Use an over-the-counter allergy medication such as cetirizine (Zyrtec) to treat for postnasal drip and sore throat.  Your strep test and mononucleosis screen are still pending.  If anything results as positive, you will receive a call from an urgent care staff member.  Take care, Dr. Sharlet Salina, NP-c      Recommended Follow up Care:  Patient encouraged to follow up with the following provider within the specified time frame, or sooner as dictated by the severity of her symptoms. As always, she was instructed that for any  urgent/emergent care needs, she should seek care either here or in the emergency department for more immediate evaluation.   Bailey Mech, DNP, NP-c    Bailey Mech, NP 03/09/21 1053

## 2021-03-18 ENCOUNTER — Ambulatory Visit
Admission: RE | Admit: 2021-03-18 | Discharge: 2021-03-18 | Disposition: A | Discharge status: Home / Self Care | Payer: Medicaid Other | Source: Ambulatory Visit

## 2021-03-18 ENCOUNTER — Other Ambulatory Visit: Payer: Self-pay

## 2021-03-18 VITALS — BP 108/71 | HR 92 | Temp 98.1°F | Resp 18 | Ht 62.0 in | Wt 190.0 lb

## 2021-03-18 DIAGNOSIS — E876 Hypokalemia: Secondary | ICD-10-CM

## 2021-03-18 DIAGNOSIS — R197 Diarrhea, unspecified: Secondary | ICD-10-CM

## 2021-03-18 DIAGNOSIS — E871 Hypo-osmolality and hyponatremia: Secondary | ICD-10-CM

## 2021-03-18 DIAGNOSIS — R1110 Vomiting, unspecified: Secondary | ICD-10-CM

## 2021-03-18 DIAGNOSIS — E860 Dehydration: Secondary | ICD-10-CM | POA: Diagnosis not present

## 2021-03-18 LAB — CBC WITH DIFFERENTIAL/PLATELET
Abs Immature Granulocytes: 0.04 10*3/uL (ref 0.00–0.07)
Basophils Absolute: 0 10*3/uL (ref 0.0–0.1)
Basophils Relative: 0 %
Eosinophils Absolute: 0.1 10*3/uL (ref 0.0–0.5)
Eosinophils Relative: 1 %
HCT: 41.7 % (ref 36.0–46.0)
Hemoglobin: 13.9 g/dL (ref 12.0–15.0)
Immature Granulocytes: 1 %
Lymphocytes Relative: 23 %
Lymphs Abs: 1.9 10*3/uL (ref 0.7–4.0)
MCH: 26.5 pg (ref 26.0–34.0)
MCHC: 33.3 g/dL (ref 30.0–36.0)
MCV: 79.6 fL — ABNORMAL LOW (ref 80.0–100.0)
Monocytes Absolute: 0.6 10*3/uL (ref 0.1–1.0)
Monocytes Relative: 8 %
Neutro Abs: 5.5 10*3/uL (ref 1.7–7.7)
Neutrophils Relative %: 67 %
Platelets: 321 10*3/uL (ref 150–400)
RBC: 5.24 MIL/uL — ABNORMAL HIGH (ref 3.87–5.11)
RDW: 13.2 % (ref 11.5–15.5)
WBC: 8.2 10*3/uL (ref 4.0–10.5)
nRBC: 0 % (ref 0.0–0.2)

## 2021-03-18 LAB — COMPREHENSIVE METABOLIC PANEL
ALT: 23 U/L (ref 0–44)
AST: 16 U/L (ref 15–41)
Albumin: 4.1 g/dL (ref 3.5–5.0)
Alkaline Phosphatase: 80 U/L (ref 38–126)
Anion gap: 10 (ref 5–15)
BUN: 9 mg/dL (ref 6–20)
CO2: 21 mmol/L — ABNORMAL LOW (ref 22–32)
Calcium: 9.1 mg/dL (ref 8.9–10.3)
Chloride: 103 mmol/L (ref 98–111)
Creatinine, Ser: 0.55 mg/dL (ref 0.44–1.00)
GFR, Estimated: >60 mL/min (ref >60)
Glucose, Bld: 93 mg/dL (ref 70–99)
Potassium: 2.9 mmol/L — ABNORMAL LOW (ref 3.5–5.1)
Sodium: 134 mmol/L — ABNORMAL LOW (ref 135–145)
Total Bilirubin: 0.5 mg/dL (ref 0.3–1.2)
Total Protein: 8.2 g/dL — ABNORMAL HIGH (ref 6.5–8.1)

## 2021-03-18 LAB — C DIFFICILE QUICK SCREEN W PCR REFLEX
C Diff antigen: NEGATIVE
C Diff interpretation: NOT DETECTED
C Diff toxin: NEGATIVE

## 2021-03-18 MED ORDER — SODIUM CHLORIDE 0.9 % IV BOLUS
1000.0000 mL | Freq: Once | INTRAVENOUS | Status: AC
  Administered 2021-03-18: 1000 mL via INTRAVENOUS

## 2021-03-18 MED ORDER — ONDANSETRON 8 MG PO TBDP
8.0000 mg | ORAL_TABLET | Freq: Once | ORAL | Status: AC
  Administered 2021-03-18: 8 mg via ORAL

## 2021-03-18 NOTE — ED Notes (Signed)
Patient is being discharged from the Urgent Care and sent to the Emergency Department via private vehicle. Per Dreama Saa, PA, patient is in need of higher level of care due to low sodium and potassium. Patient is aware and verbalizes understanding of plan of care.  Vitals:   03/18/21 1519  BP: 108/71  Pulse: 92  Resp: 18  Temp: 98.1 F (36.7 C)  SpO2: 97%

## 2021-03-18 NOTE — ED Triage Notes (Signed)
Patient in today c/o emesis and diarrhea x 1 week. Patient was seen at [facility] 03/09/21 and states she was having symptoms at that time, but not as bad.

## 2021-03-18 NOTE — ED Provider Notes (Signed)
[facility]    CSN: [id number] Arrival date & time: 03/18/21  1459      History   Chief Complaint Chief Complaint  Patient presents with  . Appointment  . Emesis  . Diarrhea    HPI Angela Mckee is a 30 y.o. female who presents with vomiting since yesterday x 2 yesterday and x 3 today. She was in the hospital for HA last week and was admitted at [facility]  Onset of diarrhea x 1 week and has 30 per day and  Did not address this with the hospital when she was admitted last week. She has had antibiotics but does not know the name. She has been on her period, so cant tell if there is blood in her stool. Has not eaten any solids in 3 days. She did not go to ER as told by them if she got worse, because she wanted to come here to get IV fluids which she had received in the past.   Past Medical History:  Diagnosis Date  . Asthma    WELL CONTROLLED  . Bradycardia    PT STATES THAT AT DR Lacretia Nicks [facility] OFFICE Dec 25, 2017 THAT HER PULSE WAS 49-PT STATES SHE DOES HAVE OCC DIZZINESS.  HER LAST PULSE CHECKED IN DR WARDS OFFICE ON 01-04-18 WAS 80.  PT ASYMPTOMATIC OTHERWISE  . Complication of anesthesia    HARD TO WAKE UP  . DVT (deep venous thrombosis) (HCC) 2018   AFTER WEIGHT LOSS SURGERY  . GERD (gastroesophageal reflux disease)    NO MEDS  . Headache    MIGRAINES  . Hemorrhoids    PT HAVING HEMORRHOID BANDING IN OFFICE ON 01-07-18  . History of kidney stones    H/O  . Panic attack   . PONV (postoperative nausea and vomiting)     Patient Active Problem List   Diagnosis Date Noted  . S/P cesarean section 12/04/2019  . Depression 12/04/2019  . Headache 09/05/2019    Past Surgical History:  Procedure Laterality Date  . ADENOIDECTOMY    . CESAREAN SECTION     X2  . CESAREAN SECTION N/A 12/02/2019   Procedure: REPEAT CESAREAN SECTION;  Surgeon: Ward, Elenora Fender, MD;  Location: [facility] ORS;  Service: Obstetrics;  Laterality: N/A;  . CHOLECYSTECTOMY    . HYSTEROSCOPY WITH D  & C N/A 01/11/2018   Procedure: DILATATION AND CURETTAGE /HYSTEROSCOPY;  Surgeon: Ward, Elenora Fender, MD;  Location: [facility] ORS;  Service: Gynecology;  Laterality: N/A;  . 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 RECTAL EXAM UNDER ANESTHESIA N/A 01/11/2018   Procedure: RECTAL EXAM UNDER ANESTHESIA;  Surgeon: Ward, Elenora Fender, MD;  Location: [facility] ORS;  Service: Gynecology;  Laterality: N/A;  . SPHINCTEROTOMY  01/11/2018   Procedure: SPHINCTEROTOMY;  Surgeon: Ward, Elenora Fender, MD;  Location: [facility] ORS;  Service: Gynecology;;  . TONSILLECTOMY      OB History    Gravida  3   Para  3   Term  3   Preterm      AB      Living  3     SAB      IAB      Ectopic      Multiple  0   Live Births  3            Home Medications    Prior to Admission medications  Medication Sig Start Date End Date Taking? Authorizing Provider  acyclovir (ZOVIRAX) 400 MG tablet Take 400 mg by mouth 3 (three) times daily. 11/03/19  Yes [provider]  Butalbital-APAP-Caffeine 50-300-40 MG CAPS Take 1 capsule by mouth every 4 (four) hours as needed for headache. 03/14/21  Yes [provider]  gabapentin (NEURONTIN) 300 MG capsule Take 300 mg by mouth 3 (three) times daily. 03/14/21  Yes [provider]  ibuprofen (ADVIL) 800 MG tablet Take 1 tablet (800 mg total) by mouth 3 (three) times daily. 03/09/21  Yes Bailey Mech, NP  promethazine (PHENERGAN) 12.5 MG tablet Take 12.5 mg by mouth every 6 (six) hours as needed. 03/12/21  Yes [provider]  SUMAtriptan (IMITREX) 100 MG tablet Take 100 mg by mouth once as needed. 03/12/21  Yes [provider]  clonazePAM (KLONOPIN) 0.5 MG tablet Take 0.5 mg by mouth 2 (two) times daily as needed. 05/10/20 03/18/21  [provider]  escitalopram (LEXAPRO) 10 MG tablet Take 10 mg by mouth every morning.   03/18/21  [provider]     Family History Family History  Problem Relation Age of Onset  . Skin cancer Mother   . Depression Mother   . Anxiety disorder Mother   . Hypertension Father   . Hyperlipidemia Father     Social History Social History   Tobacco Use  . Smoking status: Former Smoker    Packs/day: 0.25    Years: 8.00    Pack years: 2.00    Types: Cigarettes    Quit date: 08/05/2017    Years since quitting: 3.6  . Smokeless tobacco: Never Used  Vaping Use  . Vaping Use: Former  . Quit date: 01/09/2021  . Substances: Nicotine  Substance Use Topics  . Alcohol use: Yes    Comment: occasional  . Drug use: No     Allergies   Morphine and related   Review of Systems Review of Systems  Constitutional: Positive for activity change, appetite change and fatigue. Negative for chills, diaphoresis and fever.  HENT: Negative for congestion, ear discharge, ear pain, postnasal drip, rhinorrhea and sore throat.   Respiratory: Negative for cough, chest tightness and shortness of breath.   Cardiovascular: Negative for chest pain.  Gastrointestinal: Positive for abdominal pain, diarrhea, nausea and vomiting.  Genitourinary: Negative for difficulty urinating and dysuria.  Musculoskeletal: Negative for gait problem and myalgias.  Skin: Negative for rash.  Neurological: Positive for weakness. Negative for headaches.  Hematological: Negative for adenopathy.     Physical Exam Triage Vital Signs ED Triage Vitals  Enc Vitals Group     BP 03/18/21 1519 108/71     Pulse Rate 03/18/21 1519 92     Resp 03/18/21 1519 18     Temp 03/18/21 1519 98.1 F (36.7 C)     Temp Source 03/18/21 1519 Oral     SpO2 03/18/21 1519 97 %     Weight 03/18/21 1520 190 lb (86.2 kg)     Height 03/18/21 1520 5\' 2"  (1.575 m)     Head Circumference --      Peak Flow --      Pain Score 03/18/21 1521 0     Pain Loc --      Pain Edu? --      Excl. in GC? --    No data found.  Updated Vital Signs BP 108/71 (BP  Location: Left Arm)   Pulse 92   Temp 98.1 F (36.7 C) (  Oral)   Resp 18   Ht 5\' 2"  (1.575 m)   Wt 190 lb (86.2 kg)   SpO2 97%   BMI 34.75 kg/m   Visual Acuity Right Eye Distance:   Left Eye Distance:   Bilateral Distance:    Right Eye Near:   Left Eye Near:    Bilateral Near:     Physical Exam Vitals and nursing note reviewed.  Constitutional:      Appearance: She is obese. She is ill-appearing. She is not diaphoretic.  HENT:     Head: Normocephalic.     Right Ear: External ear normal.     Left Ear: External ear normal.     Mouth/Throat:     Mouth: Mucous membranes are dry.  Eyes:     General: No scleral icterus.    Conjunctiva/sclera: Conjunctivae normal.  Cardiovascular:     Rate and Rhythm: Normal rate and regular rhythm.     Heart sounds: No murmur heard.   Pulmonary:     Effort: Pulmonary effort is normal.     Breath sounds: Normal breath sounds.  Abdominal:     General: Bowel sounds are normal. There is no distension.     Palpations: Abdomen is soft.     Tenderness: There is abdominal tenderness. There is no guarding or rebound.  Musculoskeletal:        General: Normal range of motion.     Cervical back: Neck supple.  Skin:    General: Skin is warm and dry.     Findings: No rash.  Neurological:     Mental Status: She is alert and oriented to person, place, and time.     Gait: Gait normal.  Psychiatric:        Mood and Affect: Mood normal.        Behavior: Behavior normal.        Thought Content: Thought content normal.        Judgment: Judgment normal.      UC Treatments / Results  Labs (all labs ordered are listed, but only abnormal results are displayed) Labs Reviewed  COMPREHENSIVE METABOLIC PANEL - Abnormal; Notable for the following components:      Result Value   Sodium 134 (*)    Potassium 2.9 (*)    CO2 21 (*)    Total Protein 8.2 (*)    All other components within normal limits  CBC WITH DIFFERENTIAL/PLATELET - Abnormal; Notable  for the following components:   RBC 5.24 (*)    MCV 79.6 (*)    All other components within normal limits  C DIFFICILE QUICK SCREEN W PCR REFLEX  GASTROINTESTINAL PANEL BY PCR, STOOL (REPLACES STOOL CULTURE)    EKG   Radiology No results found.  Procedures Procedures (including critical care time)  Medications Ordered in UC Medications  ondansetron (ZOFRAN-ODT) disintegrating tablet 8 mg (8 mg Oral Given 03/18/21 1552)  sodium chloride 0.9 % bolus 1,000 mL (0 mLs Intravenous Stopped 03/18/21 1837)    Initial Impression / Assessment and Plan / UC Course  I have reviewed the triage vital signs and the nursing notes. Pertinent labs  results that were available during my care of the patient were reviewed by me and considered in my medical decision making (see chart for details). She was given Zofran 8 mg ODT which helped her and while here she did not vomit. While here we did collect stool for studies.  She was given 750 ml of normal saline via IV,  after this she felt better and safe to drive herself to the hospital where she needs to get re-admitted and get her potasium and sodium back to normal which we could not give her here since we dont have any potasium.   Final Clinical Impressions(s) / UC Diagnoses   Final diagnoses:  Hypokalemia  Hyponatremia  Dehydration  Diarrhea, unspecified type     Discharge Instructions     You need to go back to the hospital since your sodium is slightly low and your potasium is lower than last week when you were in the hospital Since you have been having so much diarrhea, you need to have stool test done.  Your white cell count does not show signs of bacterial infection.  The stool studies will be back in a few days, if positive we will call you.     ED Prescriptions    None     PDMP not reviewed this encounter.   Garey Ham, Cordelia Poche 03/18/21 2258

## 2021-03-18 NOTE — Discharge Instructions (Addendum)
You need to go back to the hospital since your sodium is slightly low and your potasium is lower than last week when you were in the hospital Since you have been having so much diarrhea, you need to have stool test done.  Your white cell count does not show signs of bacterial infection.  The stool studies will be back in a few days, if positive we will call you.

## 2021-03-19 ENCOUNTER — Telehealth: Payer: Self-pay | Admitting: Emergency Medicine

## 2021-03-19 DIAGNOSIS — A045 Campylobacter enteritis: Secondary | ICD-10-CM

## 2021-03-19 LAB — GASTROINTESTINAL PANEL BY PCR, STOOL (REPLACES STOOL CULTURE)
Adenovirus F40/41: DETECTED — AB
Astrovirus: NOT DETECTED
Campylobacter species: DETECTED — AB
Cryptosporidium: NOT DETECTED
Cyclospora cayetanensis: NOT DETECTED
Entamoeba histolytica: NOT DETECTED
Enteroaggregative E coli (EAEC): NOT DETECTED
Enteropathogenic E coli (EPEC): NOT DETECTED
Enterotoxigenic E coli (ETEC): NOT DETECTED
Giardia lamblia: NOT DETECTED
Norovirus GI/GII: NOT DETECTED
Plesimonas shigelloides: NOT DETECTED
Rotavirus A: NOT DETECTED
Salmonella species: NOT DETECTED
Sapovirus (I, II, IV, and V): NOT DETECTED
Shiga like toxin producing E coli (STEC): NOT DETECTED
Shigella/Enteroinvasive E coli (EIEC): NOT DETECTED
Vibrio cholerae: NOT DETECTED
Vibrio species: NOT DETECTED
Yersinia enterocolitica: NOT DETECTED

## 2021-03-19 MED ORDER — AZITHROMYCIN 250 MG PO TABS: 500.0000 mg | ORAL_TABLET | Freq: Every day | ORAL | 0 refills | Status: AC

## 2021-03-19 NOTE — Telephone Encounter (Signed)
[facility] lab called with GI panel results. Spoke with Eusebio Friendly, PA and script for Azithromycin 500mg  x 3 days sent to pharmacy on file. LM for patient to return call and sent MyChart message for patient.

## 2021-05-07 ENCOUNTER — Other Ambulatory Visit: Payer: Self-pay

## 2021-05-07 ENCOUNTER — Ambulatory Visit
Admission: RE | Admit: 2021-05-07 | Discharge: 2021-05-07 | Disposition: A | Discharge status: Home / Self Care | Payer: Medicaid Other | Source: Ambulatory Visit | Attending: Sports Medicine | Admitting: Sports Medicine

## 2021-05-07 VITALS — BP 111/73 | HR 87 | Temp 98.3°F | Resp 18 | Ht 62.0 in | Wt 200.0 lb

## 2021-05-07 DIAGNOSIS — H938X3 Other specified disorders of ear, bilateral: Secondary | ICD-10-CM | POA: Insufficient documentation

## 2021-05-07 DIAGNOSIS — R509 Fever, unspecified: Secondary | ICD-10-CM | POA: Diagnosis not present

## 2021-05-07 DIAGNOSIS — B9789 Other viral agents as the cause of diseases classified elsewhere: Secondary | ICD-10-CM | POA: Insufficient documentation

## 2021-05-07 DIAGNOSIS — R0981 Nasal congestion: Secondary | ICD-10-CM | POA: Diagnosis present

## 2021-05-07 DIAGNOSIS — Z20822 Contact with and (suspected) exposure to covid-19: Secondary | ICD-10-CM | POA: Insufficient documentation

## 2021-05-07 DIAGNOSIS — Z87891 Personal history of nicotine dependence: Secondary | ICD-10-CM | POA: Insufficient documentation

## 2021-05-07 DIAGNOSIS — J028 Acute pharyngitis due to other specified organisms: Secondary | ICD-10-CM | POA: Insufficient documentation

## 2021-05-07 DIAGNOSIS — J069 Acute upper respiratory infection, unspecified: Secondary | ICD-10-CM | POA: Diagnosis present

## 2021-05-07 MED ORDER — FLUTICASONE PROPIONATE 50 MCG/ACT NA SUSP: 2.0000 | Freq: Every day | NASAL | 0 refills | Status: AC

## 2021-05-07 NOTE — Discharge Instructions (Signed)
As we discussed, your symptoms are consistent with the viral upper respiratory infection.  I did send in a prescription for Flonase for your nasal congestion.  I also gave you some educational handouts for your symptoms. Plenty of rest, plenty fluids, Tylenol or Motrin for any fever or discomfort. Your COVID test was pending and someone will contact you with a positive result.  Please follow along on MyChart to check your results on your own. I provided you a work note just saying you were seen today. If symptoms persist please see her primary care provider. If they worsen in any way please go to the ER.

## 2021-05-07 NOTE — ED Provider Notes (Signed)
[facility]    CSN: [id number] Arrival date & time: 05/07/21  1345      History   Chief Complaint Chief Complaint  Patient presents with  . Cough    HPI Angela Mckee is a 30 y.o. female.   Patient is a 30 year old female who presents for evaluation of the above issue.  She normally sees [facility] here in [place].  They were unavailable to see her today.  She works at a bank.  She reports several days of URI symptoms that include nasal congestion, bilateral ear pain left greater than right, headache, fatigue, cough, and mild sore throat.  Complicating her situation is she was exposed to COVID from her mother as well as one of her children who attend daycare.  She reports having a fever last night did not take any meds.  She is currently afebrile.  Only uses Sudafed.  She denies chest pain shortness of breath.  No red flag signs or symptoms elicited on history.     Past Medical History:  Diagnosis Date  . Asthma    WELL CONTROLLED  . Bradycardia    PT STATES THAT AT DR Lacretia Nicks [facility] OFFICE Dec 25, 2017 THAT HER PULSE WAS 49-PT STATES SHE DOES HAVE OCC DIZZINESS.  HER LAST PULSE CHECKED IN DR WARDS OFFICE ON 01-04-18 WAS 80.  PT ASYMPTOMATIC OTHERWISE  . Complication of anesthesia    HARD TO WAKE UP  . DVT (deep venous thrombosis) (HCC) 2018   AFTER WEIGHT LOSS SURGERY  . GERD (gastroesophageal reflux disease)    NO MEDS  . Headache    MIGRAINES  . Hemorrhoids    PT HAVING HEMORRHOID BANDING IN OFFICE ON 01-07-18  . History of kidney stones    H/O  . Panic attack   . PONV (postoperative nausea and vomiting)     Patient Active Problem List   Diagnosis Date Noted  . S/P cesarean section 12/04/2019  . Depression 12/04/2019  . Headache 09/05/2019    Past Surgical History:  Procedure Laterality Date  . ADENOIDECTOMY    . CESAREAN SECTION     X2  . CESAREAN SECTION N/A 12/02/2019   Procedure: REPEAT CESAREAN SECTION;  Surgeon: Ward, Elenora Fender, MD;   Location: [facility] ORS;  Service: Obstetrics;  Laterality: N/A;  . CHOLECYSTECTOMY    . HYSTEROSCOPY WITH D & C N/A 01/11/2018   Procedure: DILATATION AND CURETTAGE /HYSTEROSCOPY;  Surgeon: Ward, Elenora Fender, MD;  Location: [facility] ORS;  Service: Gynecology;  Laterality: N/A;  . 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 RECTAL EXAM UNDER ANESTHESIA N/A 01/11/2018   Procedure: RECTAL EXAM UNDER ANESTHESIA;  Surgeon: Ward, Elenora Fender, MD;  Location: [facility] ORS;  Service: Gynecology;  Laterality: N/A;  . SPHINCTEROTOMY  01/11/2018   Procedure: SPHINCTEROTOMY;  Surgeon: Ward, Elenora Fender, MD;  Location: [facility] ORS;  Service: Gynecology;;  . TONSILLECTOMY      OB History    Gravida  3   Para  3   Term  3   Preterm      AB      Living  3     SAB      IAB      Ectopic      Multiple  0   Live Births  3            Home Medications    Prior  to Admission medications   Medication Sig Start Date End Date Taking? Authorizing Provider  acyclovir (ZOVIRAX) 400 MG tablet Take 400 mg by mouth 3 (three) times daily. 11/03/19  Yes [provider]  Butalbital-APAP-Caffeine 50-300-40 MG CAPS Take 1 capsule by mouth every 4 (four) hours as needed for headache. 03/14/21  Yes [provider]  FLUoxetine (PROZAC) 10 MG capsule Take by mouth. 05/02/21  Yes [provider]  fluticasone (FLONASE) 50 MCG/ACT nasal spray Place 2 sprays into both nostrils daily. 05/07/21  Yes Delton See, MD  ibuprofen (ADVIL) 800 MG tablet Take 1 tablet (800 mg total) by mouth 3 (three) times daily. 03/09/21  Yes Bailey Mech, NP  prazosin (MINIPRESS) 1 MG capsule Take 1 mg by mouth at bedtime. 05/02/21  Yes [provider]  promethazine (PHENERGAN) 12.5 MG tablet Take 12.5 mg by mouth every 6 (six) hours as needed. 03/12/21  Yes [provider]  SUMAtriptan (IMITREX) 100 MG tablet Take 100 mg by mouth once  as needed. 03/12/21  Yes [provider]  gabapentin (NEURONTIN) 300 MG capsule Take 300 mg by mouth 3 (three) times daily. 03/14/21   [provider]  clonazePAM (KLONOPIN) 0.5 MG tablet Take 0.5 mg by mouth 2 (two) times daily as needed. 05/10/20 03/18/21  [provider]  escitalopram (LEXAPRO) 10 MG tablet Take 10 mg by mouth every morning.   03/18/21  [provider]    Family History Family History  Problem Relation Age of Onset  . Skin cancer Mother   . Depression Mother   . Anxiety disorder Mother   . Hypertension Father   . Hyperlipidemia Father     Social History Social History   Tobacco Use  . Smoking status: Former Smoker    Packs/day: 0.25    Years: 8.00    Pack years: 2.00    Types: Cigarettes    Quit date: 08/05/2017    Years since quitting: 3.7  . Smokeless tobacco: Never Used  Vaping Use  . Vaping Use: Former  . Quit date: 01/09/2021  . Substances: Nicotine  Substance Use Topics  . Alcohol use: Yes    Comment: occasional  . Drug use: No     Allergies   Morphine and related   Review of Systems Review of Systems  Constitutional: Positive for fatigue and fever. Negative for appetite change, chills and diaphoresis.  HENT: Positive for congestion, ear pain and rhinorrhea. Negative for ear discharge, postnasal drip, sinus pressure, sinus pain, sneezing and sore throat.   Eyes: Negative for pain.  Respiratory: Negative for cough, chest tightness, shortness of breath, wheezing and stridor.   Cardiovascular: Negative for chest pain and palpitations.  Gastrointestinal: Negative for abdominal pain, diarrhea, nausea and vomiting.  Genitourinary: Negative for dysuria.  Musculoskeletal: Negative for back pain, myalgias and neck pain.  Skin: Negative for color change, pallor, rash and wound.  Neurological: Positive for headaches. Negative for dizziness, seizures, syncope and light-headedness.  All other systems reviewed and are  negative.    Physical Exam Triage Vital Signs ED Triage Vitals  Enc Vitals Group     BP      Pulse      Resp      Temp      Temp src      SpO2      Weight      Height      Head Circumference      Peak Flow      Pain  Score      Pain Loc      Pain Edu?      Excl. in GC?    No data found.  Updated Vital Signs BP 111/73 (BP Location: Left Arm)   Pulse 87   Temp 98.3 F (36.8 C) (Oral)   Resp 18   Ht  (1.575 m)   Wt 90.7 kg   SpO2 98%   BMI 36.58 kg/m   Visual Acuity Right Eye Distance:   Left Eye Distance:   Bilateral Distance:    Right Eye Near:   Left Eye Near:    Bilateral Near:     Physical Exam Vitals and nursing note reviewed.  Constitutional:      General: She is not in acute distress.    Appearance: Normal appearance. She is not ill-appearing, toxic-appearing or diaphoretic.  HENT:     Head: Normocephalic and atraumatic.     Right Ear: Tympanic membrane normal.     Left Ear: Tympanic membrane normal.     Nose: Congestion present.     Mouth/Throat:     Mouth: Mucous membranes are moist.     Pharynx: No oropharyngeal exudate or posterior oropharyngeal erythema.  Eyes:     General: No scleral icterus.       Right eye: No discharge.     Extraocular Movements: Extraocular movements intact.     Conjunctiva/sclera: Conjunctivae normal.     Pupils: Pupils are equal, round, and reactive to light.  Cardiovascular:     Rate and Rhythm: Normal rate and regular rhythm.     Pulses: Normal pulses.     Heart sounds: Normal heart sounds. No murmur heard. No friction rub. No gallop.   Pulmonary:     Effort: Pulmonary effort is normal.     Breath sounds: Normal breath sounds. No stridor. No wheezing, rhonchi or rales.  Musculoskeletal:     Cervical back: Normal range of motion and neck supple. No rigidity or tenderness.  Lymphadenopathy:     Cervical: Cervical adenopathy present.  Skin:    General: Skin is warm and dry.     Capillary Refill:  Capillary refill takes less than 2 seconds.     Findings: No bruising, erythema, lesion or rash.  Neurological:     General: No focal deficit present.     Mental Status: She is alert and oriented to person, place, and time.  Psychiatric:        Mood and Affect: Mood normal.      UC Treatments / Results  Labs (all labs ordered are listed, but only abnormal results are displayed) Labs Reviewed  SARS CORONAVIRUS 2 (TAT 6-24 HRS)    EKG   Radiology No results found.  Procedures Procedures (including critical care time)  Medications Ordered in UC Medications - No data to display  Initial Impression / Assessment and Plan / UC Course  I have reviewed the triage vital signs and the nursing notes.  Pertinent labs & imaging results that were available during my care of the patient were reviewed by me and considered in my medical decision making (see chart for details).  Clinical impression: Several days of URI symptoms.  Is consistent with a viral process.  She does have nasal congestion, bilateral ear pressure with a normal exam, mildly sore throat, fever last night but afebrile today.  She does have a confirmed exposure to COVID-19.  Treatment plan: 1.  The findings and treatment plan were discussed in detail with  the patient.  Patient was in agreement. 2.  I recommended getting a COVID test.  It was sent to the hospital.  Someone will contact her with a positive result. 3.  Educational handouts provided. 4.  For nasal congestion gave her prescription for Flonase.  Sent it to her pharmacy. 5.  Plenty of rest, plenty fluids, Tylenol or Motrin for any fever or discomfort. 6.  Gave her a work note saying that she was seen today. 7.  If symptoms were to persist have asked her to see her primary care provider. 8.  If symptoms worsen that she should go to the ER. 9.  She was discharged in stable condition and will follow-up here as needed.    Final Clinical Impressions(s) / UC  Diagnoses   Final diagnoses:  Viral upper respiratory tract infection  Nasal congestion  Sore throat (viral)  Ear pressure, bilateral  Febrile illness  Exposure to confirmed case of COVID-19     Discharge Instructions     As we discussed, your symptoms are consistent with the viral upper respiratory infection.  I did send in a prescription for Flonase for your nasal congestion.  I also gave you some educational handouts for your symptoms. Plenty of rest, plenty fluids, Tylenol or Motrin for any fever or discomfort. Your COVID test was pending and someone will contact you with a positive result.  Please follow along on MyChart to check your results on your own. I provided you a work note just saying you were seen today. If symptoms persist please see her primary care provider. If they worsen in any way please go to the ER.    ED Prescriptions    Medication Sig Dispense Auth. Provider   fluticasone (FLONASE) 50 MCG/ACT nasal spray Place 2 sprays into both nostrils daily. 15.8 mL Delton See, MD     PDMP not reviewed this encounter.   Delton See, MD 05/07/21 [phone]

## 2021-05-07 NOTE — ED Triage Notes (Signed)
Patient states that she has been having nasal congestion, cough, fatigue, headache since Saturday. States that she was exposed to covid via her mother.

## 2021-05-08 LAB — SARS CORONAVIRUS 2 (TAT 6-24 HRS): SARS Coronavirus 2: NEGATIVE
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
## 2021-09-16 IMAGING — CR DG CHEST 2V
2 series · 2 of 2 positions shown · non-contrast
Comparison: None.

CLINICAL DATA: Pain following recent fall

EXAM:
CHEST - 2 VIEW

[chest pa]
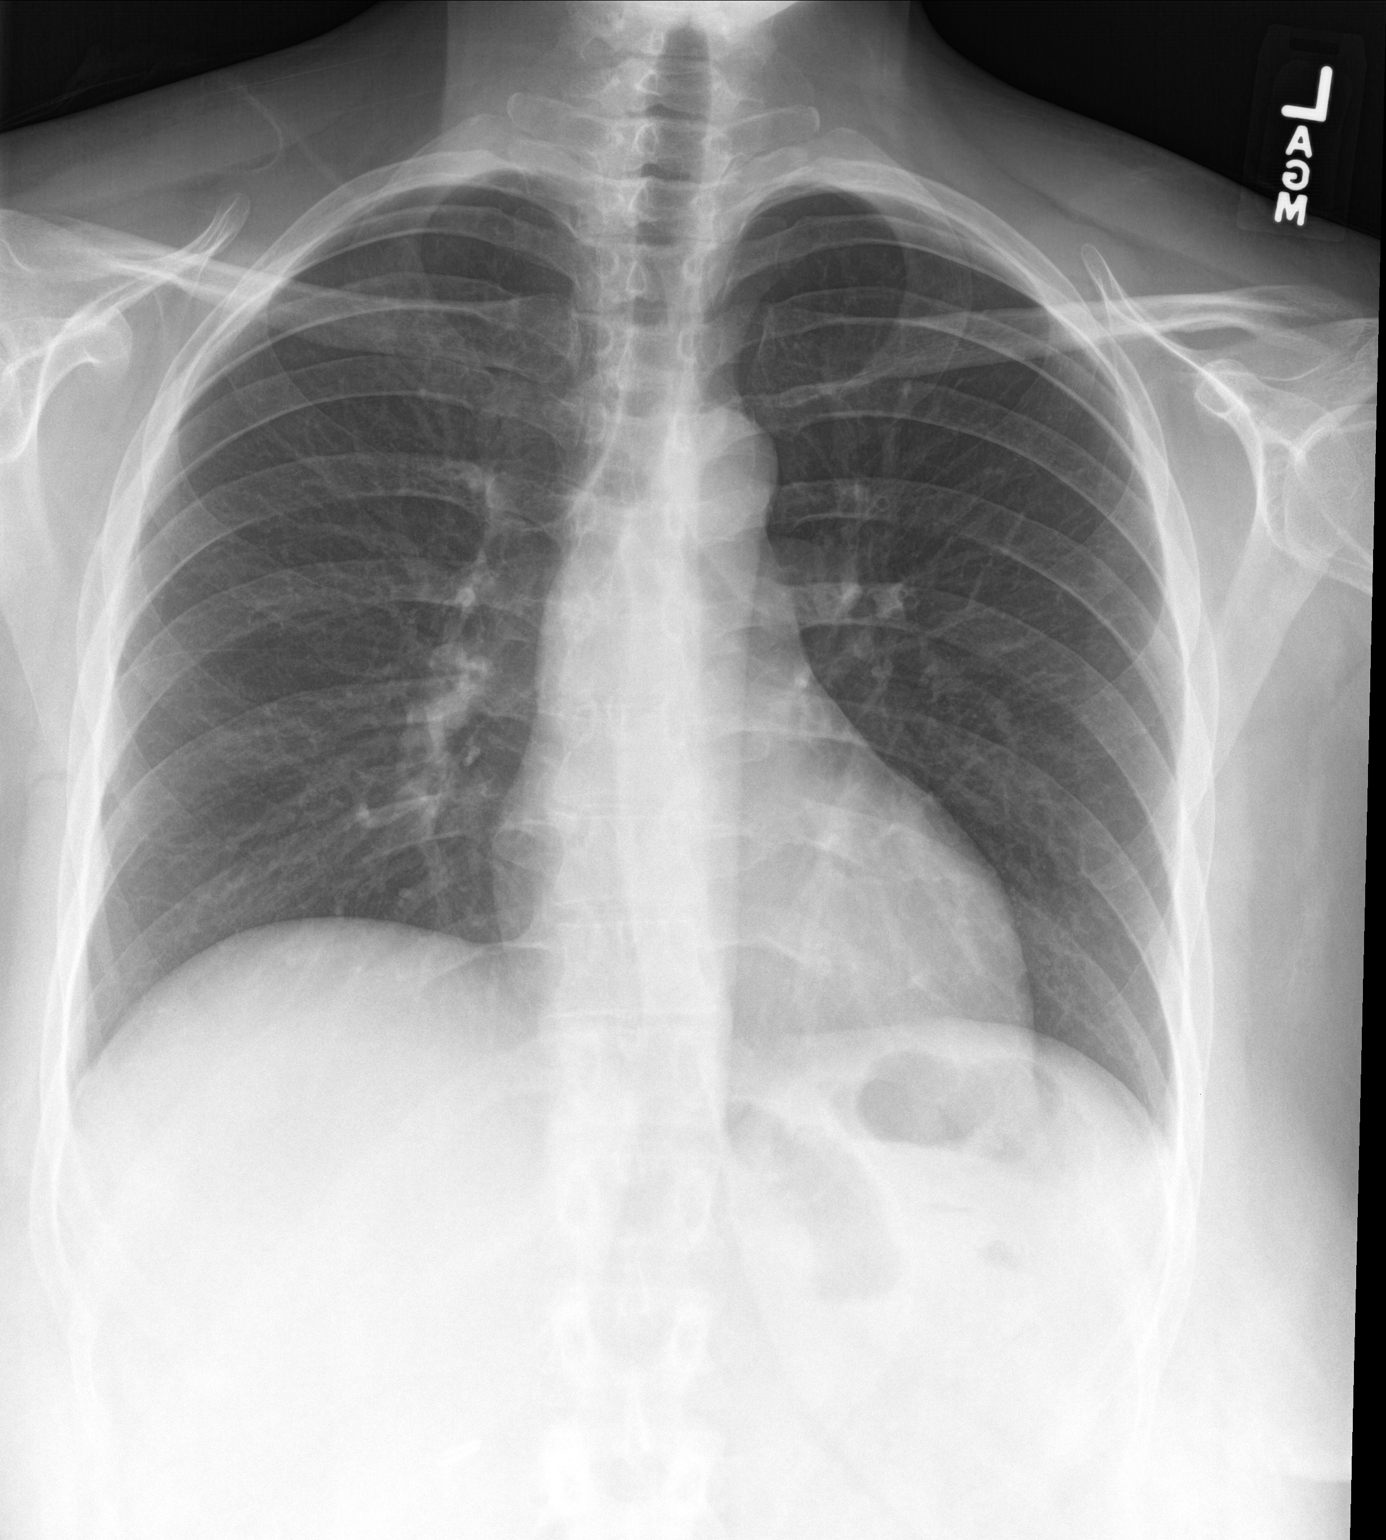

[chest lat]
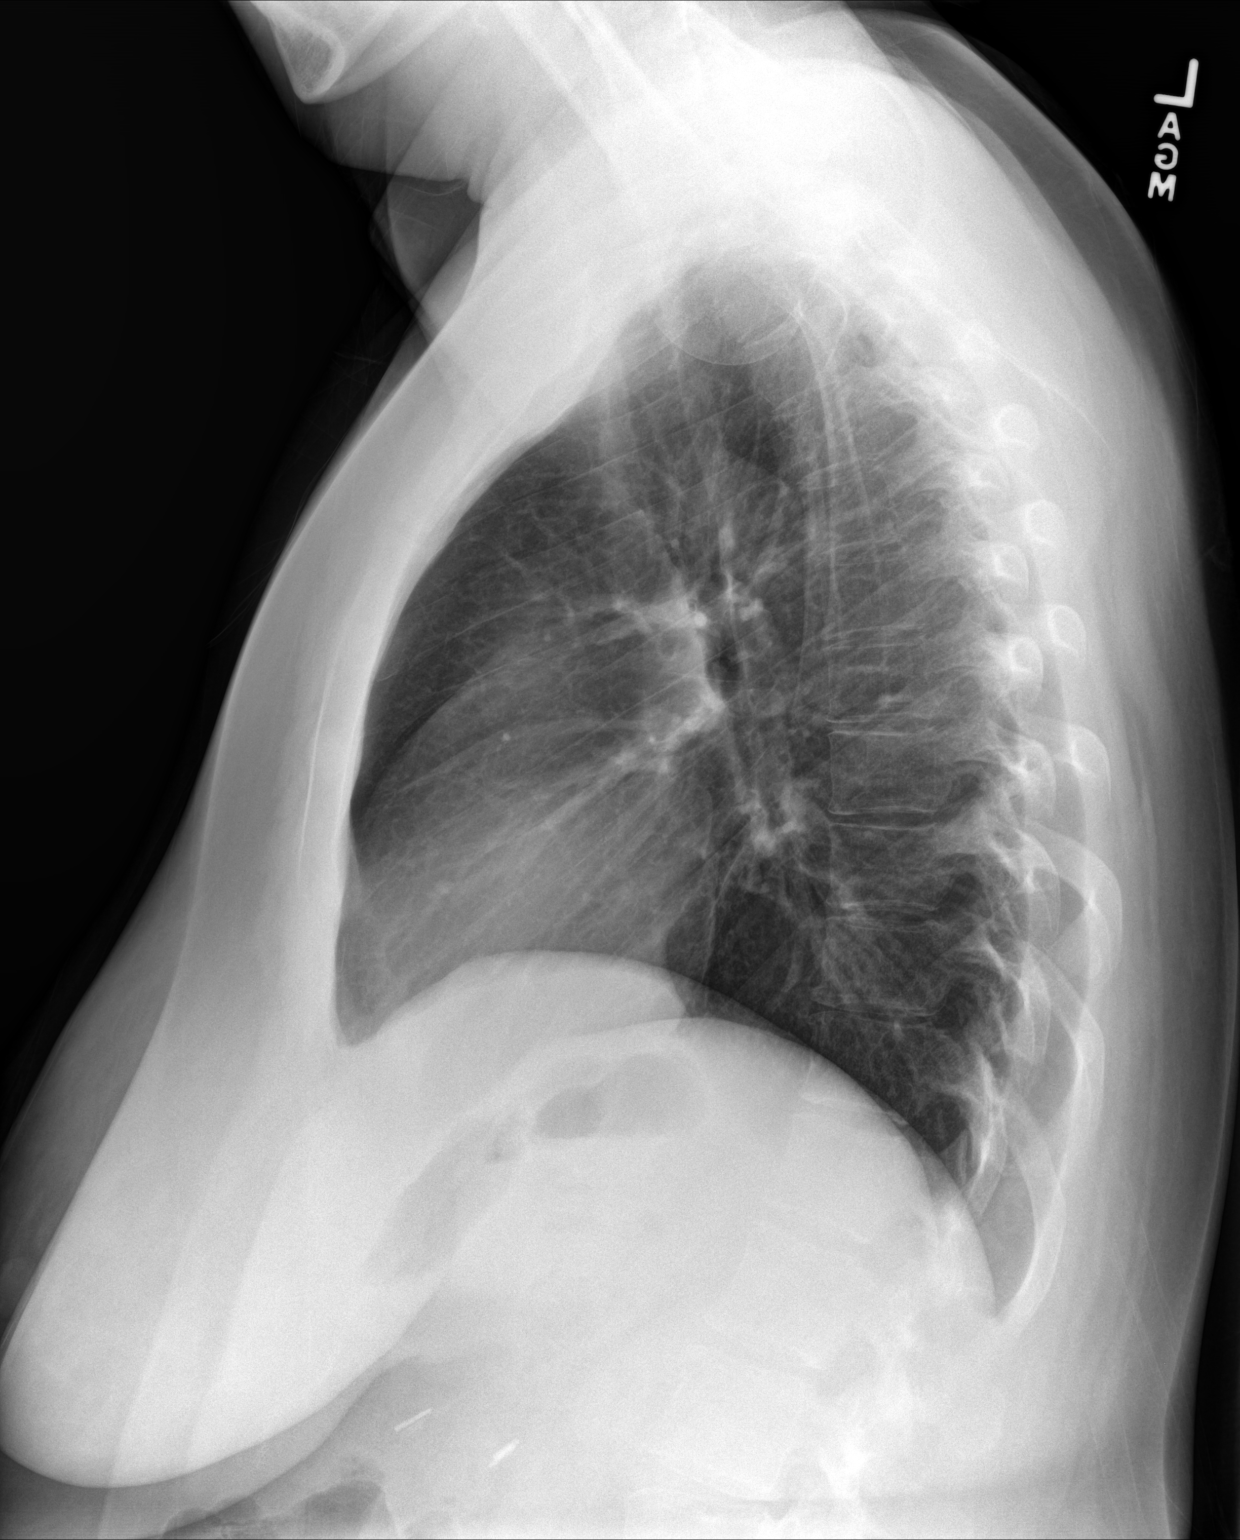

[2 of 2 positions shown; findings below may reference images not displayed]

FINDINGS: Lungs are clear. Heart size and pulmonary vascularity are normal. No
adenopathy. No pneumothorax. No bone lesions evident.
IMPRESSION: No abnormality noted.

## 2021-10-10 ENCOUNTER — Telehealth: Payer: Self-pay

## 2021-10-10 NOTE — Telephone Encounter (Signed)
[facility] referring for menorrhagia. Paper records in Epic. Any provider. Called and left voicemail for patient to call back to be scheduled.

## 2021-10-11 NOTE — Telephone Encounter (Signed)
Called and left voicemail for patient to call back to be scheduled. 

## 2021-10-15 NOTE — Telephone Encounter (Signed)
Called and left voicemail for patient to call back to be scheduled. 

## 2021-10-18 NOTE — Telephone Encounter (Signed)
Called and left voicemail for patient to call back to be scheduled.  Multiple attempts to reach patient were unsuccessful.

## 2021-12-06 ENCOUNTER — Ambulatory Visit: Payer: Self-pay

## 2022-09-08 ENCOUNTER — Ambulatory Visit
Admission: EM | Admit: 2022-09-08 | Discharge: 2022-09-08 | Disposition: A | Discharge status: Home / Self Care | Payer: Medicaid Other | Attending: Emergency Medicine | Admitting: Emergency Medicine

## 2022-09-08 DIAGNOSIS — B084 Enteroviral vesicular stomatitis with exanthem: Secondary | ICD-10-CM | POA: Diagnosis not present

## 2022-09-08 LAB — GROUP A STREP BY PCR: Group A Strep by PCR: NOT DETECTED

## 2022-09-08 MED ORDER — LIDOCAINE VISCOUS HCL 2 % MT SOLN: 5.0000 mL | Freq: Four times a day (QID) | OROMUCOSAL | 0 refills | Status: AC | PRN

## 2022-09-08 NOTE — ED Triage Notes (Signed)
Pt c/o headache into neck, hx of migraines, sore throat, fatigued. Pt states throat red & noticed white patches

## 2022-09-08 NOTE — Discharge Instructions (Signed)
Your strep test was negative today and your clinical presentation is most consistent with hand-foot-and-mouth disease.  This is an upper respiratory viral infection that is spread by respiratory droplet.  You are most infectious during the first week of your symptoms and you need to wear a mask around other people to prevent spread.  You may use Tylenol and ibuprofen according to the package instructions as needed for pain.  Continue your Imitrex as needed for your headache and migraines.  The hand-foot-and-mouth disease may be the trigger, along with working double shifts as sleep deprivation can exacerbate migraine headaches.  Use the Magic mouthwash every 6 hours as needed.  Swish, gargle, and spit the solution.  You may also use over-the-counter Chloraseptic or Sucrets lozenges.  Do not use more than 1 lozenge every 2 hours as the menthol may give you diarrhea.  Return for reevaluation, or see your PCP, for continued or worsening symptoms.

## 2022-09-08 NOTE — ED Provider Notes (Signed)
[facility]    CSN: [id number] Arrival date & time: 09/08/22  1118      History   Chief Complaint Chief Complaint  Patient presents with   Headache   Sore Throat    HPI Angela Mckee is a 31 y.o. female.   HPI  31 year old female here here for evaluation of sore throat.  She states that she has been experiencing global headache that extends down into her neck after for the past 2 weeks.  She does have a history of migraines and states she is having take her migraine medicine daily.  She also reports that she has been working a lot of double shifts.  3 days ago she developed sore throat and fatigue.  2 days ago she noticed redness to the back of her throat and white spots.  She denies any fever, runny nose, nasal congestion, cough, or GI symptoms.  No known sick contacts.  Past Medical History:  Diagnosis Date   Asthma    WELL CONTROLLED   Bradycardia    PT STATES THAT AT DR Marveen Reeks OFFICE Dec 25, 2017 THAT HER PULSE WAS 49-PT STATES SHE DOES HAVE OCC DIZZINESS.  HER LAST PULSE CHECKED IN DR WARDS OFFICE ON 01-04-18 WAS 80.  PT ASYMPTOMATIC OTHERWISE   Complication of anesthesia    HARD TO WAKE UP   DVT (deep venous thrombosis) (HCC) 2018   AFTER WEIGHT LOSS SURGERY   GERD (gastroesophageal reflux disease)    NO MEDS   Headache    MIGRAINES   Hemorrhoids    PT HAVING HEMORRHOID BANDING IN OFFICE ON 01-07-18   History of kidney stones    H/O   Panic attack    PONV (postoperative nausea and vomiting)     Patient Active Problem List   Diagnosis Date Noted   S/P cesarean section 12/04/2019   Depression 12/04/2019   Headache 09/05/2019    Past Surgical History:  Procedure Laterality Date   ADENOIDECTOMY     CESAREAN SECTION     X2   CESAREAN SECTION N/A 12/02/2019   Procedure: REPEAT CESAREAN SECTION;  Surgeon: Ward, Elenora Fender, MD;  Location: [facility];  Service: Obstetrics;  Laterality: N/A;   CHOLECYSTECTOMY     HYSTEROSCOPY WITH D & C N/A  01/11/2018   Procedure: DILATATION AND CURETTAGE /HYSTEROSCOPY;  Surgeon: Ward, Elenora Fender, MD;  Location: [facility];  Service: Gynecology;  Laterality: N/A;   LAPAROSCOPIC GASTRIC RESTRICTIVE DUODENAL PROCEDURE (DUODENAL SWITCH)     AND HAD HER GB REMOVED AT THE SAME TIME   RECTAL EXAM UNDER ANESTHESIA N/A 01/11/2018   Procedure: RECTAL EXAM UNDER ANESTHESIA;  Surgeon: Ward, Elenora Fender, MD;  Location: [facility];  Service: Gynecology;  Laterality: N/A;   SPHINCTEROTOMY  01/11/2018   Procedure: SPHINCTEROTOMY;  Surgeon: Ward, Elenora Fender, MD;  Location: [facility];  Service: Gynecology;;   TONSILLECTOMY      OB History     Gravida  3   Para  3   Term  3   Preterm      AB      Living  3      SAB      IAB      Ectopic      Multiple  0   Live Births  3            Home Medications    Prior to Admission medications   Medication Sig Start Date End Date Taking? Authorizing Provider  acyclovir (ZOVIRAX) 400 MG tablet Take 400 mg by mouth 3 (three) times daily. 11/03/19  Yes [provider]  magic mouthwash (lidocaine, diphenhydrAMINE, alum & mag hydroxide) suspension Swish and spit 5 mLs 4 (four) times daily as needed for mouth pain. 09/08/22  Yes Margarette Canada, NP  SUMAtriptan (IMITREX) 100 MG tablet Take 100 mg by mouth once as needed. 03/12/21  Yes [provider]  Butalbital-APAP-Caffeine 50-300-40 MG CAPS Take 1 capsule by mouth every 4 (four) hours as needed for headache. 03/14/21   [provider]  FLUoxetine (PROZAC) 10 MG capsule Take by mouth. 05/02/21   [provider]  fluticasone (FLONASE) 50 MCG/ACT nasal spray Place 2 sprays into both nostrils daily. 05/07/21   Verda Cumins, MD  gabapentin (NEURONTIN) 300 MG capsule Take 300 mg by mouth 3 (three) times daily. 03/14/21   [provider]  ibuprofen (ADVIL) 800 MG tablet Take 1 tablet (800 mg total) by mouth 3 (three) times daily. 03/09/21   Gertie Baron, NP  prazosin  (MINIPRESS) 1 MG capsule Take 1 mg by mouth at bedtime. 05/02/21   [provider]  promethazine (PHENERGAN) 12.5 MG tablet Take 12.5 mg by mouth every 6 (six) hours as needed. 03/12/21   [provider]  clonazePAM (KLONOPIN) 0.5 MG tablet Take 0.5 mg by mouth 2 (two) times daily as needed. 05/10/20 03/18/21  [provider]  escitalopram (LEXAPRO) 10 MG tablet Take 10 mg by mouth every morning.   03/18/21  [provider]    Family History Family History  Problem Relation Age of Onset   Skin cancer Mother    Depression Mother    Anxiety disorder Mother    Hypertension Father    Hyperlipidemia Father     Social History Social History   Tobacco Use   Smoking status: Former    Packs/day: 0.25    Years: 8.00    Total pack years: 2.00    Types: Cigarettes    Quit date: 08/05/2017    Years since quitting: 5.0   Smokeless tobacco: Never  Vaping Use   Vaping Use: Former   Quit date: 01/09/2021   Substances: Nicotine  Substance Use Topics   Alcohol use: Yes    Comment: occasional   Drug use: No     Allergies   Morphine and related   Review of Systems Review of Systems  Constitutional:  Negative for fever.  HENT:  Positive for ear pain and sore throat. Negative for rhinorrhea.   Respiratory:  Negative for cough, shortness of breath and wheezing.   Gastrointestinal:  Negative for diarrhea, nausea and vomiting.  Musculoskeletal:  Positive for neck pain.  Skin:  Negative for rash.  Neurological:  Positive for headaches.  Hematological: Negative.   Psychiatric/Behavioral: Negative.       Physical Exam Triage Vital Signs ED Triage Vitals  Enc Vitals Group     BP 09/08/22 1208 117/78     Pulse Rate 09/08/22 1208 (!) 54     Resp --      Temp 09/08/22 1208 98.2 F (36.8 C)     Temp Source 09/08/22 1208 Oral     SpO2 09/08/22 1208 98 %     Weight 09/08/22 1207 200 lb (90.7 kg)     Height 09/08/22 1207 5\' 2"  (1.575 m)     Head  Circumference --      Peak Flow --      Pain Score 09/08/22 1207 8  Pain Loc --      Pain Edu? --      Excl. in GC? --    No data found.  Updated Vital Signs BP 117/78 (BP Location: Right Arm)   Pulse (!) 54   Temp 98.2 F (36.8 C) (Oral)   Ht 5\' 2"  (1.575 m)   Wt 200 lb (90.7 kg)   SpO2 98%   BMI 36.58 kg/m   Visual Acuity Right Eye Distance:   Left Eye Distance:   Bilateral Distance:    Right Eye Near:   Left Eye Near:    Bilateral Near:     Physical Exam Vitals and nursing note reviewed.  Constitutional:      Appearance: Normal appearance. She is not ill-appearing.  HENT:     Head: Normocephalic and atraumatic.     Right Ear: Tympanic membrane, ear canal and external ear normal. There is no impacted cerumen.     Left Ear: Tympanic membrane, ear canal and external ear normal. There is no impacted cerumen.     Nose: Nose normal. No congestion or rhinorrhea.     Mouth/Throat:     Mouth: Mucous membranes are moist.     Pharynx: Posterior oropharyngeal erythema present. No oropharyngeal exudate.  Cardiovascular:     Rate and Rhythm: Normal rate and regular rhythm.     Pulses: Normal pulses.     Heart sounds: Normal heart sounds. No murmur heard.    No friction rub. No gallop.  Pulmonary:     Effort: Pulmonary effort is normal.     Breath sounds: Normal breath sounds. No wheezing, rhonchi or rales.  Musculoskeletal:     Cervical back: Normal range of motion and neck supple. No tenderness.  Lymphadenopathy:     Cervical: No cervical adenopathy.  Skin:    General: Skin is warm and dry.     Capillary Refill: Capillary refill takes less than 2 seconds.     Findings: No erythema or rash.  Neurological:     General: No focal deficit present.     Mental Status: She is alert and oriented to person, place, and time.  Psychiatric:        Mood and Affect: Mood normal.        Behavior: Behavior normal.        Thought Content: Thought content normal.         Judgment: Judgment normal.      UC Treatments / Results  Labs (all labs ordered are listed, but only abnormal results are displayed) Labs Reviewed  GROUP A STREP BY PCR    EKG   Radiology No results found.  Procedures Procedures (including critical care time)  Medications Ordered in UC Medications - No data to display  Initial Impression / Assessment and Plan / UC Course  I have reviewed the triage vital signs and the nursing notes.  Pertinent labs & imaging results that were available during my care of the patient were reviewed by me and considered in my medical decision making (see chart for details).   Patient is a pleasant, nontoxic-appearing 31 year old female here for evaluation of 2 weeks with a headache that she describes as being all over and extending down into her neck.  She states this is very similar to her migraines.  She states she did have an aura of flashing lights 3 to 4 days ago but does not typically get auras and has not had it since.  She has been taking her migraine  medicine daily.  3 days ago she developed a sore throat and fatigue.  2 days ago she noticed redness in the back of her throat and white spots.  She has not had any rashes or other upper or lower respiratory symptoms.  She denies GI symptoms.  No known sick contacts.  She states she has been working a lot of doubles.  This may very well be what is setting of her migraines.  On exam patient pupils are equal and reactive and her EOM is intact.  Tympanic membrane's are pearly gray bilaterally with normal light reflex and clear external auditory canals.  Nasal mucosa is pink and moist without erythema, edema, or discharge.  Oropharyngeal exam reveals erythema to the soft palate with white ulcers, most prominently on the left.  I do not appreciate any anterior or posterior cervical lymphadenopathy on exam.  Cardiopulmonary exam reveals clear lung sounds in all fields.  Strep PCR selected at triage patient's  presentation is more akin to hand-foot-and-mouth disease.  Strep PCR is negative.  I will discharge patient home with a diagnosis of hand-foot-and-mouth disease.  I have advised her that she is most contagious during her first 7 days of symptoms and that she needs to wear a mask when she is around other people as that is spread by respiratory droplet.  I will discharge her home with some Magic mouthwash to help her throat.  She can continue Tylenol and ibuprofen as well.   Final Clinical Impressions(s) / UC Diagnoses   Final diagnoses:  Hand, foot and mouth disease (HFMD)     Discharge Instructions      Your strep test was negative today and your clinical presentation is most consistent with hand-foot-and-mouth disease.  This is an upper respiratory viral infection that is spread by respiratory droplet.  You are most infectious during the first week of your symptoms and you need to wear a mask around other people to prevent spread.  You may use Tylenol and ibuprofen according to the package instructions as needed for pain.  Continue your Imitrex as needed for your headache and migraines.  The hand-foot-and-mouth disease may be the trigger, along with working double shifts as sleep deprivation can exacerbate migraine headaches.  Use the Magic mouthwash every 6 hours as needed.  Swish, gargle, and spit the solution.  You may also use over-the-counter Chloraseptic or Sucrets lozenges.  Do not use more than 1 lozenge every 2 hours as the menthol may give you diarrhea.  Return for reevaluation, or see your PCP, for continued or worsening symptoms.     ED Prescriptions     Medication Sig Dispense Auth. Provider   magic mouthwash (lidocaine, diphenhydrAMINE, alum & mag hydroxide) suspension Swish and spit 5 mLs 4 (four) times daily as needed for mouth pain. 360 mL Becky Augustayan, Conway Fedora, NP      PDMP not reviewed this encounter.   Becky Augustayan, Aizen Duval, NP 09/08/22 1247

## 2022-10-02 ENCOUNTER — Ambulatory Visit
Admission: EM | Admit: 2022-10-02 | Discharge: 2022-10-02 | Disposition: A | Discharge status: Home / Self Care | Payer: Medicaid Other | Attending: Family Medicine | Admitting: Family Medicine

## 2022-10-02 ENCOUNTER — Encounter: Payer: Self-pay | Admitting: Emergency Medicine

## 2022-10-02 DIAGNOSIS — Z20822 Contact with and (suspected) exposure to covid-19: Secondary | ICD-10-CM | POA: Insufficient documentation

## 2022-10-02 DIAGNOSIS — J069 Acute upper respiratory infection, unspecified: Secondary | ICD-10-CM | POA: Insufficient documentation

## 2022-10-02 LAB — RESP PANEL BY RT-PCR (FLU A&B, COVID) ARPGX2
Influenza A by PCR: NEGATIVE
Influenza B by PCR: NEGATIVE
SARS Coronavirus 2 by RT PCR: NEGATIVE

## 2022-10-02 LAB — GROUP A STREP BY PCR: Group A Strep by PCR: NOT DETECTED

## 2022-10-02 MED ORDER — PROMETHAZINE-DM 6.25-15 MG/5ML PO SYRP: 5.0000 mL | ORAL_SOLUTION | Freq: Four times a day (QID) | ORAL | 0 refills | Status: AC | PRN

## 2022-10-02 NOTE — Discharge Instructions (Signed)
We will contact you if your COVID or influenza test is positive.  Please quarantine while you wait for the results.  If your test is negative you may resume normal activities.  If your test is positive please continue to quarantine for at least 5 days from your symptom onset or until you are without a fever for at least 24 hours after the medications.   You can take Tylenol and/or Ibuprofen as needed for fever reduction and pain relief.    For cough: honey 1/2 to 1 teaspoon (you can dilute the honey in water or another fluid).  You can also use guaifenesin and dextromethorphan for cough. You can use a humidifier for chest congestion and cough.  If you don't have a humidifier, you can sit in the bathroom with the hot shower running.      For sore throat: try warm salt water gargles, cepacol lozenges, throat spray, warm tea or water with lemon/honey, popsicles or ice, or OTC cold relief medicine for throat discomfort.    For congestion: take a daily anti-histamine like Zyrtec, Claritin, and a oral decongestant, such as pseudoephedrine.  You can also use Flonase 1-2 sprays in each nostril daily.    It is important to stay hydrated: drink plenty of fluids (water, gatorade/powerade/pedialyte, juices, or teas) to keep your throat moisturized and help further relieve irritation/discomfort.    Return or go to the Emergency Department if symptoms worsen or do not improve in the next few days  

## 2022-10-02 NOTE — ED Provider Notes (Signed)
[facility]    CSN: [id number] Arrival date & time: 10/02/22  1317      History   Chief Complaint Chief Complaint  Patient presents with   Sore Throat    HPI Angela Mckee is a 31 y.o. female.   HPI   Angela Mckee presents for nasal congestion, cough, sore throat, fatigue, headache, lightheadedness that started on Tuesday.  She has been taking Sudafed D without relief.  She went to work on Wednesday and her symptoms got worse.  She feels like she is having a headache and neck tightness.   She has been drinking hot tea without relief.  Denies fever, chills.  States that she feels rundown.  Of note, her sons have similar symptoms.  She is worried that her 41-year-old daughter will catch similar symptoms.     Past Medical History:  Diagnosis Date   Asthma    WELL CONTROLLED   Bradycardia    PT STATES THAT AT DR Marveen Reeks OFFICE Dec 25, 2017 THAT HER PULSE WAS 49-PT STATES SHE DOES HAVE OCC DIZZINESS.  HER LAST PULSE CHECKED IN DR WARDS OFFICE ON 01-04-18 WAS 80.  PT ASYMPTOMATIC OTHERWISE   Complication of anesthesia    HARD TO WAKE UP   DVT (deep venous thrombosis) (HCC) 2018   AFTER WEIGHT LOSS SURGERY   GERD (gastroesophageal reflux disease)    NO MEDS   Headache    MIGRAINES   Hemorrhoids    PT HAVING HEMORRHOID BANDING IN OFFICE ON 01-07-18   History of kidney stones    H/O   Panic attack    PONV (postoperative nausea and vomiting)     Patient Active Problem List   Diagnosis Date Noted   S/P cesarean section 12/04/2019   Depression 12/04/2019   Headache 09/05/2019    Past Surgical History:  Procedure Laterality Date   ADENOIDECTOMY     CESAREAN SECTION     X2   CESAREAN SECTION N/A 12/02/2019   Procedure: REPEAT CESAREAN SECTION;  Surgeon: Ward, Elenora Fender, MD;  Location: [facility];  Service: Obstetrics;  Laterality: N/A;   CHOLECYSTECTOMY     HYSTEROSCOPY WITH D & C N/A 01/11/2018   Procedure: DILATATION AND CURETTAGE /HYSTEROSCOPY;  Surgeon:  Ward, Elenora Fender, MD;  Location: [facility];  Service: Gynecology;  Laterality: N/A;   LAPAROSCOPIC GASTRIC RESTRICTIVE DUODENAL PROCEDURE (DUODENAL SWITCH)     AND HAD HER GB REMOVED AT THE SAME TIME   RECTAL EXAM UNDER ANESTHESIA N/A 01/11/2018   Procedure: RECTAL EXAM UNDER ANESTHESIA;  Surgeon: Ward, Elenora Fender, MD;  Location: [facility];  Service: Gynecology;  Laterality: N/A;   SPHINCTEROTOMY  01/11/2018   Procedure: SPHINCTEROTOMY;  Surgeon: Ward, Elenora Fender, MD;  Location: [facility];  Service: Gynecology;;   TONSILLECTOMY      OB History     Gravida  3   Para  3   Term  3   Preterm      AB      Living  3      SAB      IAB      Ectopic      Multiple  0   Live Births  3            Home Medications    Prior to Admission medications   Medication Sig Start Date End Date Taking? Authorizing Provider  acyclovir (ZOVIRAX) 400 MG tablet Take 400 mg by mouth 3 (three) times daily. 11/03/19  Yes  [provider]  Butalbital-APAP-Caffeine 50-300-40 MG CAPS Take 1 capsule by mouth every 4 (four) hours as needed for headache. 03/14/21  Yes [provider]  FLUoxetine (PROZAC) 10 MG capsule Take by mouth. 05/02/21  Yes [provider]  gabapentin (NEURONTIN) 300 MG capsule Take 300 mg by mouth 3 (three) times daily. 03/14/21  Yes [provider]  prazosin (MINIPRESS) 1 MG capsule Take 1 mg by mouth at bedtime. 05/02/21  Yes [provider]  promethazine (PHENERGAN) 12.5 MG tablet Take 12.5 mg by mouth every 6 (six) hours as needed. 03/12/21  Yes [provider]  promethazine-dextromethorphan (PROMETHAZINE-DM) 6.25-15 MG/5ML syrup Take 5 mLs by mouth 4 (four) times daily as needed. 10/02/22  Yes Jacori Mulrooney, DO  SUMAtriptan (IMITREX) 100 MG tablet Take 100 mg by mouth once as needed. 03/12/21  Yes [provider]  fluticasone (FLONASE) 50 MCG/ACT nasal spray Place 2 sprays into both nostrils daily. 05/07/21   Delton See, MD  ibuprofen (ADVIL) 800 MG tablet Take 1 tablet (800 mg total) by mouth 3 (three) times daily. 03/09/21   Bailey Mech, NP  magic mouthwash (lidocaine, diphenhydrAMINE, alum & mag hydroxide) suspension Swish and spit 5 mLs 4 (four) times daily as needed for mouth pain. 09/08/22   Becky Augusta, NP  clonazePAM (KLONOPIN) 0.5 MG tablet Take 0.5 mg by mouth 2 (two) times daily as needed. 05/10/20 03/18/21  [provider]  escitalopram (LEXAPRO) 10 MG tablet Take 10 mg by mouth every morning.   03/18/21  [provider]    Family History Family History  Problem Relation Age of Onset   Skin cancer Mother    Depression Mother    Anxiety disorder Mother    Hypertension Father    Hyperlipidemia Father     Social History Social History   Tobacco Use   Smoking status: Former    Packs/day: 0.25    Years: 8.00    Total pack years: 2.00    Types: Cigarettes    Quit date: 08/05/2017    Years since quitting: 5.1   Smokeless tobacco: Never  Vaping Use   Vaping Use: Former   Quit date: 01/09/2021   Substances: Nicotine  Substance Use Topics   Alcohol use: Yes    Comment: occasional   Drug use: No     Allergies   Morphine and related   Review of Systems Review of Systems: negative unless otherwise stated in HPI.      Physical Exam Triage Vital Signs ED Triage Vitals  Enc Vitals Group     BP 10/02/22 1352 121/83     Pulse Rate 10/02/22 1352 (!) 106     Resp 10/02/22 1352 18     Temp 10/02/22 1352 98.2 F (36.8 C)     Temp Source 10/02/22 1352 Oral     SpO2 10/02/22 1352 100 %     Weight 10/02/22 1345 199 lb 15.3 oz (90.7 kg)     Height 10/02/22 1345 5\' 2"  (1.575 m)     Head Circumference --      Peak Flow --      Pain Score 10/02/22 1345 8     Pain Loc --      Pain Edu? --      Excl. in GC? --    No data found.  Updated Vital Signs BP 121/83 (BP Location: Left Arm)   Pulse (!) 106   Temp 98.2 F (36.8 C) (Oral)   Resp 18  Ht 5\' 2"   (1.575 m)   Wt 90.7 kg   SpO2 100%   BMI 36.57 kg/m   Visual Acuity Right Eye Distance:   Left Eye Distance:   Bilateral Distance:    Right Eye Near:   Left Eye Near:    Bilateral Near:     Physical Exam GEN:     alert, non-toxic appearing female in no distress     HENT:  mucus membranes moist, oropharyngeal  without lesions or  exudate, no  tonsillar hypertrophy, moderate oropharyngeal erythema , moderate erythematous edematous turbinates,  clear nasal discharge,  bilateral TM  normal EYES:   pupils equal and reactive, EOMi ,  no scleral injection NECK:  normal ROM, bilateral anterior cervical lymphadenopathy,  no meningismus   RESP:  no increased work of breathing,  clear to auscultation bilaterally CVS:   regular rhythm, tachycardic  Skin:   warm and dry, no rash on visible skin    UC Treatments / Results  Labs (all labs ordered are listed, but only abnormal results are displayed) Labs Reviewed  GROUP A STREP BY PCR  RESP PANEL BY RT-PCR (FLU A&B, COVID) [id number]    EKG   Radiology No results found.  Procedures Procedures (including critical care time)  Medications Ordered in UC Medications - No data to display  Initial Impression / Assessment and Plan / UC Course  I have reviewed the triage vital signs and the nursing notes.  Pertinent labs & imaging results that were available during my care of the patient were reviewed by me and considered in my medical decision making (see chart for details).       Pt is a 31 y.o. female who presents for a couple days of respiratory symptoms. Angela Mckee is  afebrile here without recent antipyretics. Satting well on room air. Pt is  ill-appearing, well hydrated, without respiratory distress. Pulmonary exam  is unremarkable.  Strep PCR negative. COVID  and influenza testing also negative. History consistent with  viral respiratory illness. Discussed symptomatic treatment. Promethazine DM for cough.  Explained lack of efficacy of  antibiotics in viral disease.  Typical duration of symptoms discussed.  Return and ED precautions given and patient voiced understanding. - continue age-appropriate Tylenol/ Motrin as needed for discomfort/fever - nasal saline to help with his nasal congestion - Use a mist humidifier to help with breathing - Stressed importance of hydration - Albuterol to help with chest tightness    - Work note provided, per pt request  - Discussed return and ED precautions, understanding voiced.   Discussed MDM, treatment plan and plan for follow-up with patient/parent who agrees with plan.    Final Clinical Impressions(s) / UC Diagnoses   Final diagnoses:  Viral URI with cough  Encounter for laboratory testing for COVID-19 virus     Discharge Instructions      We will contact you if your COVID or influenza test is positive.  Please quarantine while you wait for the results.  If your test is negative you may resume normal activities.  If your test is positive please continue to quarantine for at least 5 days from your symptom onset or until you are without a fever for at least 24 hours after the medications.   You can take Tylenol and/or Ibuprofen as needed for fever reduction and pain relief.    For cough: honey 1/2 to 1 teaspoon (you can dilute the honey in water or another fluid).  You can also use guaifenesin and  dextromethorphan for cough. You can use a humidifier for chest congestion and cough.  If you don't have a humidifier, you can sit in the bathroom with the hot shower running.      For sore throat: try warm salt water gargles, cepacol lozenges, throat spray, warm tea or water with lemon/honey, popsicles or ice, or OTC cold relief medicine for throat discomfort.    For congestion: take a daily anti-histamine like Zyrtec, Claritin, and a oral decongestant, such as pseudoephedrine.  You can also use Flonase 1-2 sprays in each nostril daily.    It is important to stay hydrated: drink plenty  of fluids (water, gatorade/powerade/pedialyte, juices, or teas) to keep your throat moisturized and help further relieve irritation/discomfort.    Return or go to the Emergency Department if symptoms worsen or do not improve in the next few days      ED Prescriptions     Medication Sig Dispense Auth. Provider   promethazine-dextromethorphan (PROMETHAZINE-DM) 6.25-15 MG/5ML syrup Take 5 mLs by mouth 4 (four) times daily as needed. 118 mL Katha Cabal, DO      PDMP not reviewed this encounter.   Katha Cabal, DO 10/02/22 1831

## 2022-10-02 NOTE — ED Triage Notes (Signed)
Pt c/o headache, sore throat, cough, nasal congestion, body aches. Started about 3 days ago. She states she feels like she did when she had covid.

## 2022-10-15 ENCOUNTER — Ambulatory Visit
Admission: EM | Admit: 2022-10-15 | Discharge: 2022-10-15 | Disposition: A | Discharge status: Home / Self Care | Payer: Medicaid Other | Attending: Emergency Medicine | Admitting: Emergency Medicine

## 2022-10-15 DIAGNOSIS — S39012A Strain of muscle, fascia and tendon of lower back, initial encounter: Secondary | ICD-10-CM

## 2022-10-15 MED ORDER — PREDNISONE 10 MG (21) PO TBPK: ORAL_TABLET | ORAL | 0 refills | Status: AC

## 2022-10-15 MED ORDER — BACLOFEN 10 MG PO TABS: 10.0000 mg | ORAL_TABLET | Freq: Three times a day (TID) | ORAL | 0 refills | Status: AC

## 2022-10-15 NOTE — Discharge Instructions (Signed)
Take the prednisone as directed by the package insert.  Take the baclofen, 10 mg every 8 hours, on a schedule for the next 48 hours and then as needed.  Apply moist heat to your back for 30 minutes at a time 2-3 times a day to improve blood flow to the area and help remove the lactic acid causing the spasm.  Follow the back exercises given at discharge.  Return for reevaluation for any new or worsening symptoms.

## 2022-10-15 NOTE — ED Triage Notes (Signed)
Patient reports that she fell last night on her back off of her bed..   Patient reports that the right side of her back hurts really bad. Patient reports that it hurts to turn.

## 2022-10-15 NOTE — ED Provider Notes (Signed)
[facility]    CSN: [id number] Arrival date & time: 10/15/22  9470      History   Chief Complaint Chief Complaint  Patient presents with   Fall   Back Pain    HPI Angela Mckee is a 31 y.o. female.   HPI  30 year old female here for evaluation of right lower back pain.  The patient reports that she was sitting on the edge of her bed last night and she was playing with her daughter but fell off the bed backwards and landed on a hardwood floor.  Bed is approximately a foot and half in the air.  She states that she has pain on the right side of her low back and this increases if she tries to walk or straighten her legs and back.  It is improved when she is in the fetal position.  She states that she was taking 1000 g of Naprosyn every 3 hours last night without any improvement of pain.  Past Medical History:  Diagnosis Date   Asthma    WELL CONTROLLED   Bradycardia    PT STATES THAT AT DR Marveen Reeks OFFICE Dec 25, 2017 THAT HER PULSE WAS 49-PT STATES SHE DOES HAVE OCC DIZZINESS.  HER LAST PULSE CHECKED IN DR WARDS OFFICE ON 01-04-18 WAS 80.  PT ASYMPTOMATIC OTHERWISE   Complication of anesthesia    HARD TO WAKE UP   DVT (deep venous thrombosis) (HCC) 2018   AFTER WEIGHT LOSS SURGERY   GERD (gastroesophageal reflux disease)    NO MEDS   Headache    MIGRAINES   Hemorrhoids    PT HAVING HEMORRHOID BANDING IN OFFICE ON 01-07-18   History of kidney stones    H/O   Panic attack    PONV (postoperative nausea and vomiting)     Patient Active Problem List   Diagnosis Date Noted   S/P cesarean section 12/04/2019   Depression 12/04/2019   Headache 09/05/2019    Past Surgical History:  Procedure Laterality Date   ADENOIDECTOMY     CESAREAN SECTION     X2   CESAREAN SECTION N/A 12/02/2019   Procedure: REPEAT CESAREAN SECTION;  Surgeon: Ward, Elenora Fender, MD;  Location: [facility];  Service: Obstetrics;  Laterality: N/A;   CHOLECYSTECTOMY     HYSTEROSCOPY WITH D  & C N/A 01/11/2018   Procedure: DILATATION AND CURETTAGE /HYSTEROSCOPY;  Surgeon: Ward, Elenora Fender, MD;  Location: [facility];  Service: Gynecology;  Laterality: N/A;   LAPAROSCOPIC GASTRIC RESTRICTIVE DUODENAL PROCEDURE (DUODENAL SWITCH)     AND HAD HER GB REMOVED AT THE SAME TIME   RECTAL EXAM UNDER ANESTHESIA N/A 01/11/2018   Procedure: RECTAL EXAM UNDER ANESTHESIA;  Surgeon: Ward, Elenora Fender, MD;  Location: [facility];  Service: Gynecology;  Laterality: N/A;   SPHINCTEROTOMY  01/11/2018   Procedure: SPHINCTEROTOMY;  Surgeon: Ward, Elenora Fender, MD;  Location: [facility];  Service: Gynecology;;   TONSILLECTOMY      OB History     Gravida  3   Para  3   Term  3   Preterm      AB      Living  3      SAB      IAB      Ectopic      Multiple  0   Live Births  3            Home Medications    Prior to Admission medications  Medication Sig Start Date End Date Taking? Authorizing Provider  baclofen (LIORESAL) 10 MG tablet Take 1 tablet (10 mg total) by mouth 3 (three) times daily. 10/15/22  Yes Margarette Canada, NP  predniSONE (STERAPRED UNI-PAK 21 TAB) 10 MG (21) TBPK tablet Take 6 tablets on day 1, 5 tablets day 2, 4 tablets day 3, 3 tablets day 4, 2 tablets day 5, 1 tablet day 6 10/15/22  Yes Margarette Canada, NP  acyclovir (ZOVIRAX) 400 MG tablet Take 400 mg by mouth 3 (three) times daily. 11/03/19   [provider]  Butalbital-APAP-Caffeine 50-300-40 MG CAPS Take 1 capsule by mouth every 4 (four) hours as needed for headache. 03/14/21   [provider]  FLUoxetine (PROZAC) 10 MG capsule Take by mouth. 05/02/21   [provider]  fluticasone (FLONASE) 50 MCG/ACT nasal spray Place 2 sprays into both nostrils daily. 05/07/21   Verda Cumins, MD  gabapentin (NEURONTIN) 300 MG capsule Take 300 mg by mouth 3 (three) times daily. 03/14/21   [provider]  magic mouthwash (lidocaine, diphenhydrAMINE, alum & mag hydroxide) suspension Swish and spit 5 mLs 4  (four) times daily as needed for mouth pain. 09/08/22   Margarette Canada, NP  prazosin (MINIPRESS) 1 MG capsule Take 1 mg by mouth at bedtime. 05/02/21   [provider]  promethazine (PHENERGAN) 12.5 MG tablet Take 12.5 mg by mouth every 6 (six) hours as needed. 03/12/21   [provider]  promethazine-dextromethorphan (PROMETHAZINE-DM) 6.25-15 MG/5ML syrup Take 5 mLs by mouth 4 (four) times daily as needed. 10/02/22   Brimage, Ronnette Juniper, DO  SUMAtriptan (IMITREX) 100 MG tablet Take 100 mg by mouth once as needed. 03/12/21   [provider]  clonazePAM (KLONOPIN) 0.5 MG tablet Take 0.5 mg by mouth 2 (two) times daily as needed. 05/10/20 03/18/21  [provider]  escitalopram (LEXAPRO) 10 MG tablet Take 10 mg by mouth every morning.   03/18/21  [provider]    Family History Family History  Problem Relation Age of Onset   Skin cancer Mother    Depression Mother    Anxiety disorder Mother    Hypertension Father    Hyperlipidemia Father     Social History Social History   Tobacco Use   Smoking status: Former    Packs/day: 0.25    Years: 8.00    Total pack years: 2.00    Types: Cigarettes    Quit date: 08/05/2017    Years since quitting: 5.1   Smokeless tobacco: Never  Vaping Use   Vaping Use: Former   Quit date: 01/09/2021   Substances: Nicotine  Substance Use Topics   Alcohol use: Yes    Comment: occasional   Drug use: No     Allergies   Morphine and related   Review of Systems Review of Systems  Musculoskeletal:  Positive for back pain.  Neurological:  Negative for weakness and numbness.  Hematological: Negative.   Psychiatric/Behavioral: Negative.       Physical Exam Triage Vital Signs ED Triage Vitals  Enc Vitals Group     BP 10/15/22 0923 130/79     Pulse Rate 10/15/22 0923 72     Resp --      Temp 10/15/22 0923 98.8 F (37.1 C)     Temp Source 10/15/22 0923 Oral     SpO2 10/15/22 0923 97 %     Weight 10/15/22 0922  199 lb 15.3 oz (90.7 kg)     Height 10/15/22 0922  5\' 2"  (1.575 m)     Head Circumference --      Peak Flow --      Pain Score 10/15/22 0922 10     Pain Loc --      Pain Edu? --      Excl. in GC? --    No data found.  Updated Vital Signs BP 130/79 (BP Location: Left Arm)   Pulse 72   Temp 98.8 F (37.1 C) (Oral)   Ht 5\' 2"  (1.575 m)   Wt 199 lb 15.3 oz (90.7 kg)   SpO2 97%   BMI 36.57 kg/m   Visual Acuity Right Eye Distance:   Left Eye Distance:   Bilateral Distance:    Right Eye Near:   Left Eye Near:    Bilateral Near:     Physical Exam Vitals and nursing note reviewed.  Constitutional:      Appearance: Normal appearance. She is not ill-appearing.  HENT:     Head: Normocephalic and atraumatic.  Musculoskeletal:        General: Tenderness and signs of injury present. No swelling or deformity.     Comments: Patient has tenderness of the right lumbar paraspinous region.  There is a Lidoderm patch in place.  There is no edema, ecchymosis, or erythema to the area noted.  Patient's bilateral lower extremity strength is 5/5.  She is able to ambulate and transfer from the chair to the exam table with mild difficulty secondary to pain.  Skin:    General: Skin is warm and dry.     Findings: No bruising or erythema.  Neurological:     General: No focal deficit present.     Mental Status: She is alert and oriented to person, place, and time.     Motor: No weakness.  Psychiatric:        Mood and Affect: Mood normal.        Behavior: Behavior normal.        Thought Content: Thought content normal.        Judgment: Judgment normal.      UC Treatments / Results  Labs (all labs ordered are listed, but only abnormal results are displayed) Labs Reviewed - No data to display  EKG   Radiology No results found.  Procedures Procedures (including critical care time)  Medications Ordered in UC Medications - No data to display  Initial Impression / Assessment and  Plan / UC Course  I have reviewed the triage vital signs and the nursing notes.  Pertinent labs & imaging results that were available during my care of the patient were reviewed by me and considered in my medical decision making (see chart for details).   Patient is a pleasant, nontoxic-appearing 33 old female here for evaluation of right-sided back pain that started last night after she fell off of her bed as outlined in HPI above.  Patient does have tenderness to the right lumbar paraspinous region but there is no ecchymosis, edema, or erythema noted.  There is no midline spinous tenderness present on exam.  Patient is moving all extremities independently.  Resisted flexion extension of her lower legs is 5/5.  I suspect the patient has a contusion/strain of her lumbar region secondary to the fall.  I have advised her to stop taking the Naprosyn as she is taking entirely too much and I will switch her to a prednisone pack that she can start this morning.  Also 10 mg of baclofen every 8  hours to help with spasm and muscle tension.  I have advised her to take the baclofen on a schedule for the next 2 days to help with muscle tension and spasm.  Muscle can give her home physical therapy exercises to perform.  If her symptoms do not improve I have advised her to seek care in the ER for pain control as needed.   Final Clinical Impressions(s) / UC Diagnoses   Final diagnoses:  Lumbar strain, initial encounter     Discharge Instructions      Take the prednisone as directed by the package insert.  Take the baclofen, 10 mg every 8 hours, on a schedule for the next 48 hours and then as needed.  Apply moist heat to your back for 30 minutes at a time 2-3 times a day to improve blood flow to the area and help remove the lactic acid causing the spasm.  Follow the back exercises given at discharge.  Return for reevaluation for any new or worsening symptoms.      ED Prescriptions     Medication Sig  Dispense Auth. Provider   predniSONE (STERAPRED UNI-PAK 21 TAB) 10 MG (21) TBPK tablet Take 6 tablets on day 1, 5 tablets day 2, 4 tablets day 3, 3 tablets day 4, 2 tablets day 5, 1 tablet day 6 21 tablet Becky Augusta, NP   baclofen (LIORESAL) 10 MG tablet Take 1 tablet (10 mg total) by mouth 3 (three) times daily. 30 each Becky Augusta, NP      PDMP not reviewed this encounter.   Becky Augusta, NP 10/15/22 1007

## 2023-03-26 ENCOUNTER — Other Ambulatory Visit: Payer: Self-pay | Admitting: Internal Medicine

## 2023-03-26 DIAGNOSIS — N63 Unspecified lump in unspecified breast: Secondary | ICD-10-CM

## 2023-03-26 DIAGNOSIS — N6452 Nipple discharge: Secondary | ICD-10-CM

## 2023-03-31 ENCOUNTER — Ambulatory Visit
Admission: RE | Admit: 2023-03-31 | Discharge: 2023-03-31 | Disposition: A | Discharge status: Home / Self Care | Payer: Medicaid Other | Source: Ambulatory Visit | Attending: Internal Medicine | Admitting: Internal Medicine

## 2023-03-31 DIAGNOSIS — N63 Unspecified lump in unspecified breast: Secondary | ICD-10-CM

## 2023-03-31 DIAGNOSIS — N6452 Nipple discharge: Secondary | ICD-10-CM

## 2023-08-18 ENCOUNTER — Telehealth: Payer: Self-pay | Admitting: Family Medicine

## 2023-08-18 NOTE — Telephone Encounter (Signed)
Please give me a call I was wanting to know when it was time to take out my birth control

## 2024-02-10 ENCOUNTER — Ambulatory Visit: Payer: Medicaid Other

## 2024-02-23 ENCOUNTER — Ambulatory Visit: Payer: Medicaid Other
